# Patient Record
Sex: Male | Born: 1965 | Race: White | Hispanic: No | Marital: Married | Smoking: Former smoker
Health system: Southern US, Community
[De-identification: ages and names within clinical notes are randomized; demographics above are authoritative.]

## PROBLEM LIST (undated history)

## (undated) DIAGNOSIS — M1990 Unspecified osteoarthritis, unspecified site: Secondary | ICD-10-CM

## (undated) DIAGNOSIS — E119 Type 2 diabetes mellitus without complications: Secondary | ICD-10-CM

## (undated) DIAGNOSIS — R519 Headache, unspecified: Secondary | ICD-10-CM

## (undated) DIAGNOSIS — R51 Headache: Secondary | ICD-10-CM

## (undated) DIAGNOSIS — I10 Essential (primary) hypertension: Secondary | ICD-10-CM

## (undated) DIAGNOSIS — K219 Gastro-esophageal reflux disease without esophagitis: Secondary | ICD-10-CM

## (undated) HISTORY — DX: Gastro-esophageal reflux disease without esophagitis: K21.9

## (undated) HISTORY — PX: VASECTOMY: SHX75

## (undated) HISTORY — DX: Headache, unspecified: R51.9

## (undated) HISTORY — DX: Essential (primary) hypertension: I10

## (undated) HISTORY — DX: Headache: R51

## (undated) HISTORY — DX: Type 2 diabetes mellitus without complications: E11.9

## (undated) HISTORY — DX: Unspecified osteoarthritis, unspecified site: M19.90

---
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
## 2009-06-13 ENCOUNTER — Ambulatory Visit: Payer: Self-pay | Admitting: Orthopedic Surgery

## 2014-04-27 ENCOUNTER — Emergency Department: Payer: Self-pay | Admitting: Emergency Medicine

## 2014-09-20 ENCOUNTER — Encounter (INDEPENDENT_AMBULATORY_CARE_PROVIDER_SITE_OTHER): Payer: Self-pay

## 2014-09-20 ENCOUNTER — Ambulatory Visit (INDEPENDENT_AMBULATORY_CARE_PROVIDER_SITE_OTHER): Payer: 59 | Admitting: Internal Medicine

## 2014-09-27 ENCOUNTER — Encounter: Payer: Self-pay | Admitting: Internal Medicine

## 2014-09-27 ENCOUNTER — Ambulatory Visit (INDEPENDENT_AMBULATORY_CARE_PROVIDER_SITE_OTHER): Payer: Commercial Managed Care - PPO | Admitting: Internal Medicine

## 2014-09-27 VITALS — BP 110/80 | HR 73 | Temp 98.4°F | Ht 68.0 in | Wt 186.2 lb

## 2014-09-27 DIAGNOSIS — K219 Gastro-esophageal reflux disease without esophagitis: Secondary | ICD-10-CM

## 2014-09-27 DIAGNOSIS — R42 Dizziness and giddiness: Secondary | ICD-10-CM

## 2014-09-27 DIAGNOSIS — E1165 Type 2 diabetes mellitus with hyperglycemia: Secondary | ICD-10-CM

## 2014-09-27 DIAGNOSIS — Z125 Encounter for screening for malignant neoplasm of prostate: Secondary | ICD-10-CM

## 2014-09-27 DIAGNOSIS — R5383 Other fatigue: Secondary | ICD-10-CM

## 2014-09-27 MED ORDER — PANTOPRAZOLE SODIUM 40 MG PO TBEC: 40.0000 mg | DELAYED_RELEASE_TABLET | Freq: Every day | ORAL | Status: DC

## 2014-09-27 MED ORDER — METFORMIN HCL 1000 MG PO TABS: 1000.0000 mg | ORAL_TABLET | Freq: Two times a day (BID) | ORAL | Status: DC

## 2014-09-27 NOTE — Progress Notes (Signed)
Pre visit review using our clinic review tool, if applicable. No additional management support is needed unless otherwise documented below in the visit note. 

## 2014-09-27 NOTE — Patient Instructions (Signed)
Take metformin 1000mg  twice a day  protonix 40mg  - one per day - take before breakfast  Zantac (ranitidine) 150mg  - take one before evening meal.

## 2014-09-28 ENCOUNTER — Other Ambulatory Visit (INDEPENDENT_AMBULATORY_CARE_PROVIDER_SITE_OTHER): Payer: Commercial Managed Care - PPO

## 2014-09-28 DIAGNOSIS — R5383 Other fatigue: Secondary | ICD-10-CM

## 2014-09-28 DIAGNOSIS — Z125 Encounter for screening for malignant neoplasm of prostate: Secondary | ICD-10-CM

## 2014-09-28 DIAGNOSIS — R42 Dizziness and giddiness: Secondary | ICD-10-CM | POA: Insufficient documentation

## 2014-09-28 DIAGNOSIS — E1165 Type 2 diabetes mellitus with hyperglycemia: Secondary | ICD-10-CM

## 2014-09-28 DIAGNOSIS — E119 Type 2 diabetes mellitus without complications: Secondary | ICD-10-CM | POA: Insufficient documentation

## 2014-09-28 LAB — BASIC METABOLIC PANEL
BUN: 12 mg/dL (ref 6–23)
CO2: 29 mEq/L (ref 19–32)
CREATININE: 0.9 mg/dL (ref 0.4–1.5)
Calcium: 9.4 mg/dL (ref 8.4–10.5)
Chloride: 100 mEq/L (ref 96–112)
GFR: 93.46 mL/min (ref >60.00)
Glucose, Bld: 335 mg/dL — ABNORMAL HIGH (ref 70–99)
Potassium: 4.2 mEq/L (ref 3.5–5.1)
SODIUM: 135 meq/L (ref 135–145)

## 2014-09-28 LAB — CBC WITH DIFFERENTIAL/PLATELET
BASOS PCT: 0.4 % (ref 0.0–3.0)
Basophils Absolute: 0 10*3/uL (ref 0.0–0.1)
EOS ABS: 0.1 10*3/uL (ref 0.0–0.7)
EOS PCT: 1.8 % (ref 0.0–5.0)
HEMATOCRIT: 44.9 % (ref 39.0–52.0)
Hemoglobin: 15.1 g/dL (ref 13.0–17.0)
LYMPHS PCT: 41.1 % (ref 12.0–46.0)
Lymphs Abs: 2.2 10*3/uL (ref 0.7–4.0)
MCHC: 33.7 g/dL (ref 30.0–36.0)
MCV: 92.9 fl (ref 78.0–100.0)
MONO ABS: 0.4 10*3/uL (ref 0.1–1.0)
Monocytes Relative: 7 % (ref 3.0–12.0)
NEUTROS ABS: 2.7 10*3/uL (ref 1.4–7.7)
Neutrophils Relative %: 49.7 % (ref 43.0–77.0)
Platelets: 234 10*3/uL (ref 150.0–400.0)
RBC: 4.83 Mil/uL (ref 4.22–5.81)
RDW: 12.1 % (ref 11.5–15.5)
WBC: 5.4 10*3/uL (ref 4.0–10.5)

## 2014-09-28 LAB — HEMOGLOBIN A1C: Hgb A1c MFr Bld: 14.5 % — ABNORMAL HIGH (ref 4.6–6.5)

## 2014-09-28 LAB — URINALYSIS, ROUTINE W REFLEX MICROSCOPIC
Bilirubin Urine: NEGATIVE
HGB URINE DIPSTICK: NEGATIVE
Leukocytes, UA: NEGATIVE
Nitrite: NEGATIVE
PH: 6 (ref 5.0–8.0)
RBC / HPF: NONE SEEN (ref 0–?)
SPECIFIC GRAVITY, URINE: 1.02 (ref 1.000–1.030)
TOTAL PROTEIN, URINE-UPE24: NEGATIVE
Urine Glucose: >=1000 — AB
Urobilinogen, UA: 0.2 (ref 0.0–1.0)
WBC UA: NONE SEEN (ref 0–?)

## 2014-09-28 LAB — HEPATIC FUNCTION PANEL
ALT: 23 U/L (ref 0–53)
AST: 17 U/L (ref 0–37)
Albumin: 4 g/dL (ref 3.5–5.2)
Alkaline Phosphatase: 136 U/L — ABNORMAL HIGH (ref 39–117)
Bilirubin, Direct: 0.1 mg/dL (ref 0.0–0.3)
Total Bilirubin: 0.8 mg/dL (ref 0.2–1.2)
Total Protein: 7.1 g/dL (ref 6.0–8.3)

## 2014-09-28 LAB — MICROALBUMIN / CREATININE URINE RATIO
Creatinine,U: 134.2 mg/dL
MICROALB UR: 2.2 mg/dL — AB (ref 0.0–1.9)
MICROALB/CREAT RATIO: 1.6 mg/g (ref 0.0–30.0)

## 2014-09-28 LAB — LIPID PANEL
CHOL/HDL RATIO: 7
CHOLESTEROL: 251 mg/dL — AB (ref 0–200)
HDL: 35.2 mg/dL — AB (ref >39.00)
NonHDL: 215.8
Triglycerides: 254 mg/dL — ABNORMAL HIGH (ref 0.0–149.0)
VLDL: 50.8 mg/dL — AB (ref 0.0–40.0)

## 2014-09-28 LAB — LDL CHOLESTEROL, DIRECT: Direct LDL: 159.3 mg/dL

## 2014-09-28 LAB — TSH: TSH: 2.47 u[IU]/mL (ref 0.35–4.50)

## 2014-09-28 LAB — PSA: PSA: 0.5 ng/mL (ref 0.10–4.00)

## 2014-09-29 ENCOUNTER — Encounter: Payer: Self-pay | Admitting: *Deleted

## 2014-10-02 ENCOUNTER — Encounter: Payer: Self-pay | Admitting: Internal Medicine

## 2014-10-02 DIAGNOSIS — K219 Gastro-esophageal reflux disease without esophagitis: Secondary | ICD-10-CM | POA: Insufficient documentation

## 2014-10-02 NOTE — Progress Notes (Signed)
Subjective:    Patient ID: Victor Wagner, male    DOB: Feb 17, 1966, 49 y.o.   MRN: [id number]  HPI 49 year old male who presents to establish care.  Accompanied by his wife.  History obtained from both of them.  Does not see a regular doctor.  Has family members who are diabetic.  He has been feeling more fatigued.  Some occasional light headedness.  Notices when he changes positions or moves a certain way (getting up fast).  Works long hours.  Works at [employer] - 10 hour days.  States his sister took his blood sugar.  Has ranged 480-550.  Wife reports he has been more thirsty.  He was drinking a lot of soft drinks and gatorade.  Also eating a lot of butter.  He has significant decreased the amount of soft drinks, gatorade and butter.  He eats a lot of bread.  We discussed decreasing carbohydrate intake.  He has lost weight recently.  Wife states previously 210 pounds down to 183 pounds.  No chest pain or tightness.  Does not do any formal exercise, but does stay active.  No bowel change reported.  Has never had a colonoscopy.  Does report increased acid reflux.     Past Medical History  Diagnosis Date  . Frequent headaches     H/O  . Arthritis   . GERD (gastroesophageal reflux disease)   . Hypertension   . Diabetes mellitus     Outpatient Encounter Prescriptions as of 09/27/2014  Medication Sig  . naproxen sodium (ANAPROX) 220 MG tablet Take 220 mg by mouth as needed.  . metFORMIN (GLUCOPHAGE) 1000 MG tablet Take 1 tablet (1,000 mg total) by mouth 2 (two) times daily with a meal.  . pantoprazole (PROTONIX) 40 MG tablet Take 1 tablet (40 mg total) by mouth daily.    Review of Systems Patient denies any headache.  Does report occasional light headedness as outlined.  No chest pain, tightness or palpitations.  No increased shortness of breath, cough or congestion.  No nausea or vomiting.  Increased acid reflux.  No abdominal pain or cramping.  No bowel change, such as diarrhea,  constipation, BRBPR or melana.  Increased thirst.  Increased fatigue.  Sugars as outlined.  Has made some diet changes.  Plans to get even more serious about diet and exercise.       Objective:   Physical Exam Filed Vitals:   09/27/14 1554  BP: 110/80  Pulse: 73  Temp: 98.4 F (36.9 C)   Blood pressure recheck:  63/72  49 year old male in no acute distress.  HEENT:  Nares - clear.  Oropharynx - without lesions. NECK:  Supple.  Nontender.  No audible carotid bruit.  HEART:  Appears to be regular.   LUNGS:  No crackles or wheezing audible.  Respirations even and unlabored.   RADIAL PULSE:  Equal bilaterally.  ABDOMEN:  Soft.  Nontender.  Bowel sounds present and normal.  No audible abdominal bruit.  EXTREMITIES:  No increased edema present.  DP pulses palpable and equal bilaterally.     FEET:  No lesions.        Assessment & Plan:  1. Type 2 diabetes mellitus with hyperglycemia Recently found by family members to have significantly elevated blood sugars.  Discussed at length with the patient and his wife.  Discussed diet and exercise.  Will refer to [facility].  Pt was given a glucometer today.  Was instructed on how to check  his blood sugars.  Will check blood sugars bid and record.  Return in one week.  Start metformin 1000mg  bid.  Blood sugar here today - 360. - Lipid panel; Future - Hepatic function panel; Future - Basic metabolic panel; Future - Hemoglobin A1c; Future - Microalbumin / creatinine urine ratio; Future - Urinalysis, Routine w reflex microscopic; Future  2. Other fatigue May be multifactorial.  Treat his diabetes as outlined.  Works long days.  Check cbc and tsh as well.   - CBC with Differential; Future - TSH; Future  3. Light headedness Appears to just occur occasionally and is positional.  Treat sugars as outlined.  Check routine labs.  Stay hydrated.    4. Prostate cancer screening - PSA; Future  5.  GERD.  Reflux as outlined.  Start protonix as  directed.  Follow for resolution.  May need GI referral.    HEALTH MAINTENANCE.  Will schedule him in the future for a physical.  Check psa with these labs.  Discussed need for colonoscopy.    I spent 45 minutes with the patient and more than 50% of the time was spent in consultation regarding the above, specifically obtaining history, discussing the new diagnosis of diabetes and treatment and planned follow up.

## 2014-10-05 ENCOUNTER — Ambulatory Visit (INDEPENDENT_AMBULATORY_CARE_PROVIDER_SITE_OTHER): Payer: Commercial Managed Care - PPO | Admitting: Internal Medicine

## 2014-10-05 ENCOUNTER — Other Ambulatory Visit: Payer: Self-pay | Admitting: *Deleted

## 2014-10-05 ENCOUNTER — Encounter: Payer: Self-pay | Admitting: Internal Medicine

## 2014-10-05 VITALS — BP 120/80 | HR 82 | Temp 98.5°F | Ht 68.0 in | Wt 190.0 lb

## 2014-10-05 DIAGNOSIS — R5383 Other fatigue: Secondary | ICD-10-CM

## 2014-10-05 DIAGNOSIS — E7800 Pure hypercholesterolemia, unspecified: Secondary | ICD-10-CM

## 2014-10-05 DIAGNOSIS — E1165 Type 2 diabetes mellitus with hyperglycemia: Secondary | ICD-10-CM

## 2014-10-05 DIAGNOSIS — K219 Gastro-esophageal reflux disease without esophagitis: Secondary | ICD-10-CM

## 2014-10-05 MED ORDER — INSULIN DETEMIR 100 UNIT/ML FLEXPEN: 20.0000 [IU] | Freq: Every day | SUBCUTANEOUS | Status: DC

## 2014-10-05 MED ORDER — INSULIN PEN NEEDLE 32G X 4 MM MISC: Status: DC

## 2014-10-05 NOTE — Patient Instructions (Signed)
Start 20 units of Levemir in the am.    Increase protonix to 40mg  twice a day (before breakfast and before supper)

## 2014-10-05 NOTE — Progress Notes (Signed)
Pre visit review using our clinic review tool, if applicable. No additional management support is needed unless otherwise documented below in the visit note. 

## 2014-10-09 ENCOUNTER — Encounter: Payer: Self-pay | Admitting: Internal Medicine

## 2014-10-09 DIAGNOSIS — E7800 Pure hypercholesterolemia, unspecified: Secondary | ICD-10-CM | POA: Insufficient documentation

## 2014-10-09 NOTE — Progress Notes (Signed)
Subjective:    Patient ID: Victor Wagner, male    DOB: 07-13-1966, 49 y.o.   MRN: [id number]  HPI 49 year old male recently diagnosed with diabetes.  Also found to have elevated cholesterol.  He is accompanied by his wife.  History obtained from both of them.  He has been trying to adjust his diet.  Discussed exercise.  He has cut out soft drinks.  Trying to decrease his bread intake.  Still drinks an increased amount of sweet tea.  Discussed increased carbohydrate intake.  Eats out a lot.  No chest pain or tightness.  Does not do any formal exercise, but does stay active.  No bowel change reported.  Has never had a colonoscopy.  Does report increased acid reflux.  The protonix has not made a difference yet.  Only started last week.  No increased thirst.  Feels better.  Blood sugars averaging 200-300 now.     Past Medical History  Diagnosis Date  . Frequent headaches     H/O  . Arthritis   . GERD (gastroesophageal reflux disease)   . Hypertension   . Diabetes mellitus     Outpatient Encounter Prescriptions as of 10/05/2014  Medication Sig  . metFORMIN (GLUCOPHAGE) 1000 MG tablet Take 1 tablet (1,000 mg total) by mouth 2 (two) times daily with a meal.  . naproxen sodium (ANAPROX) 220 MG tablet Take 220 mg by mouth as needed.  Letta Pate VERIO test strip 2 (two) times daily. for testing  . pantoprazole (PROTONIX) 40 MG tablet Take 1 tablet (40 mg total) by mouth daily.  . [DISCONTINUED] insulin detemir (LEVEMIR) 100 unit/ml SOLN Inject 0.2 mLs (20 Units total) into the skin at bedtime.    Review of Systems Patient denies any headache.  No significant light headedness reported.  Feels better.   No chest pain, tightness or palpitations.  No increased shortness of breath, cough or congestion.  No nausea or vomiting.  Increased acid reflux.  Started on protonix last week.  No abdominal pain or cramping.  No bowel change, such as diarrhea, constipation, BRBPR or melana.  Increased thirst has  improved.    Fatigue is better.   Sugars as outlined.  Has made some diet changes.  Plans to get even more serious about diet and exercise.  Discussed insulin.  He is agreeable today to try one shot per day.         Objective:   Physical Exam  Filed Vitals:   10/05/14 1254  BP: 120/80  Pulse: 82  Temp: 98.5 F (68.21 C)   49 year old male in no acute distress.  HEENT:  Nares - clear.  Oropharynx - without lesions. NECK:  Supple.  Nontender.  No audible carotid bruit.  HEART:  Appears to be regular.   LUNGS:  No crackles or wheezing audible.  Respirations even and unlabored.   RADIAL PULSE:  Equal bilaterally.  ABDOMEN:  Soft.  Nontender.  Bowel sounds present and normal.  No audible abdominal bruit.  EXTREMITIES:  No increased edema present.  DP pulses palpable and equal bilaterally.     FEET:  No lesions.        Assessment & Plan:  1. Gastroesophageal reflux disease, esophagitis presence not specified Persistent.  On protonix.  Increase to bid.  Follow.  May need GI referral.  Diet adjustment as discussed.    2. Type 2 diabetes mellitus with hyperglycemia Sugars elevated as outlined.  On metformin.  Start 20  units levemir q am.  Will probably need short acting with meals.  He agreed to start one shot per day.  Follow sugars.  Check and record at least bid.  Get him to f/u with endocrinology.  Refer to [facility].  Discussed diet adjustment and exercise.  Follow.  Discussed with him regarding titrating the levemir if sugars remain over 150.  Can adjust up 2 units q 4 days as directed.  Folllow.  Pt was instructed on proper way to give himself insulin.    3. Other fatigue Better with treatment of sugars follow.    4. Hypercholesterolemia Elevated cholesterol found on recent labs.  Discussed with him regarding diet adjustment and exercise.  May need medication.  Wants to work on sugars first.  Follow.     HEALTH MAINTENANCE.  Will schedule him in the future for a physical.    Discussed need for colonoscopy.    I spent 25 minutes with the patient and more than 50% of the time was spent in consultation regarding the above.

## 2014-10-10 ENCOUNTER — Ambulatory Visit: Payer: 59 | Admitting: Internal Medicine

## 2014-10-12 ENCOUNTER — Ambulatory Visit: Payer: Commercial Managed Care - PPO | Admitting: Endocrinology

## 2014-10-19 ENCOUNTER — Ambulatory Visit (INDEPENDENT_AMBULATORY_CARE_PROVIDER_SITE_OTHER): Payer: Commercial Managed Care - PPO | Admitting: Endocrinology

## 2014-10-19 ENCOUNTER — Encounter: Payer: Self-pay | Admitting: Endocrinology

## 2014-10-19 VITALS — BP 112/82 | HR 74 | Ht 68.0 in | Wt 191.5 lb

## 2014-10-19 DIAGNOSIS — E7800 Pure hypercholesterolemia, unspecified: Secondary | ICD-10-CM

## 2014-10-19 DIAGNOSIS — E1165 Type 2 diabetes mellitus with hyperglycemia: Secondary | ICD-10-CM

## 2014-10-19 LAB — HM DIABETES FOOT EXAM: HM Diabetic Foot Exam: NORMAL

## 2014-10-19 MED ORDER — INSULIN ASPART 100 UNIT/ML FLEXPEN: PEN_INJECTOR | SUBCUTANEOUS | Status: DC

## 2014-10-19 NOTE — Progress Notes (Signed)
Reason for visit-  Victor Wagner is a 49 y.o.-year-old male, referred by his PCP,  Dale Badger, MD for management of Type 2 diabetes, uncontrolled, without complications. Here with wife. Recently established care with Dr Lorin Picket.   HPI- Patient has been diagnosed with diabetes in Jan 2016 after being symptomatic with polydipsia/polyuria and weight loss. FS checked at home ranged 480-550. Following this, he has started making dietary changes and carb reductions and sugars started to improve. Started on levemir mid jan 2016.     Taking his medications and tolerating them well.   Pt is currently on a regimen of: - Metformin 1000 mg po bid - Levemir 20 units qam ~9am    Last hemoglobin A1c was: Lab Results  Component Value Date   HGBA1C 14.5* 09/28/2014     Pt checks his sugars 2-3 a day . Uses One Touch verio meter glucometer. By meter review they are:  PREMEAL Breakfast Lunch Dinner Bedtime Overall  Glucose range: 176-213  200-308 180-220   Mean/median:        POST-MEAL PC Breakfast PC Lunch PC Dinner  Glucose range: 100-134 137-301   Mean/median:       Hypoglycemia-  No lows. Lowest sugar was n/a; he has hypoglycemia awareness at 70.   Dietary habits- eats three times daily. Tries to limit carbs, sweetened beverages, sodas, desserts. Salads for lunch, biscuit for BF, supper late around 9 pm and having hard time controlling carb intake at that time. He does the cooking for the home. Cut out all sweetened beverages. Cut out bread intake significantly.  Exercise- works at [employer] - stable recently [employer] from Last 3 Encounters:  10/19/14 191 lb 8 oz (86.864 kg)  10/05/14 190 lb (86.183 kg)  09/27/14 186 lb 4 oz (84.482 kg)    Diabetes Complications-  Nephropathy- No  CKD, last BUN/creatinine-  Lab Results  Component Value Date   BUN 12 09/28/2014   CREATININE 0.9 09/28/2014   Lab Results  Component Value Date   GFR 93.46 09/28/2014   No  components found for: MICROALBCREAT   Retinopathy- Yes/No, ?, Last DEE was in n/a- feels that vision is blurry B/L, somewhat worse after starting the medications recently, using reading glasses Neuropathy- no numbness and tingling in his feet. No known neuropathy.  Associated history - No CAD . No prior stroke. No hypothyroidism. his last TSH was  Lab Results  Component Value Date   TSH 2.47 09/28/2014    Hyperlipidemia-  his last set of lipids were uncontrolled- Currently on dietary therapy. Tolerating well.   Lab Results  Component Value Date   CHOL 251* 09/28/2014   HDL 35.20* 09/28/2014   LDLDIRECT 159.3 09/28/2014   TRIG 254.0* 09/28/2014   CHOLHDL 7 09/28/2014    Blood Pressure/HTN- Patient's blood pressure is well controlled today.  Pt has FH of DM in father and sister.  I have reviewed the patient's past medical history, family and social history, surgical history, medications and allergies.  Past Medical History  Diagnosis Date  . Frequent headaches     H/O  . Arthritis   . GERD (gastroesophageal reflux disease)   . Hypertension   . Diabetes mellitus    Past Surgical History  Procedure Laterality Date  . Hernia repair  1984  . Vasectomy     Family History  Problem Relation Age of Onset  . Arthritis Father   . Heart disease Father   . Hypertension Father   .  Diabetes Father   . Heart disease Maternal Grandfather   . Diabetes Sister    History   Social History  . Marital Status: Married    Spouse Name: N/A    Number of Children: N/A  . Years of Education: N/A   Occupational History  . Not on file.   Social History Main Topics  . Smoking status: Never Smoker   . Smokeless tobacco: Current User    Types: Chew, Snuff  . Alcohol Use: 0.0 oz/week    0 Not specified per week  . Drug Use: Not on file  . Sexual Activity: Not on file   Other Topics Concern  . Not on file   Social History Narrative   Current Outpatient Prescriptions on File Prior  to Visit  Medication Sig Dispense Refill  . insulin detemir (LEVEMIR) 100 unit/ml SOLN Inject 0.2 mLs (20 Units total) into the skin at bedtime. 1 vial 2  . Insulin Pen Needle 32G X 4 MM MISC To be used once daily to inject insulin into skin 100 each 2  . metFORMIN (GLUCOPHAGE) 1000 MG tablet Take 1 tablet (1,000 mg total) by mouth 2 (two) times daily with a meal. 60 tablet 1  . naproxen sodium (ANAPROX) 220 MG tablet Take 220 mg by mouth as needed.    Letta Pate. ONETOUCH VERIO test strip 2 (two) times daily. for testing  0  . pantoprazole (PROTONIX) 40 MG tablet Take 1 tablet (40 mg total) by mouth daily. (Patient not taking: Reported on 10/19/2014) 30 tablet 1   No current facility-administered medications on file prior to visit.   No Known Allergies   Review of Systems: [x]  complains of  [  ] denies General:   [ x ] Recent weight change [  ] Fatigue  [  ] Loss of appetite Eyes: [ x ]  Vision Difficulty [  ]  Eye pain ENT: [  ]  Hearing difficulty [  ]  Difficulty Swallowing CVS: [  ] Chest pain [  ]  Palpitations/Irregular Heart beat [  ]  Shortness of breath lying flat [  ] Swelling of legs Resp: [  ] Frequent Cough [  ] Shortness of Breath  [  ]  Wheezing GI: [ x ] Heartburn  [  ] Nausea or Vomiting  [  ] Diarrhea [  ] Constipation  [  ] Abdominal Pain GU: [  ]  Polyuria  [  ]  nocturia Bones/joints:  [  ]  Muscle aches  [  ] Joint Pain  [  ] Bone pain Skin/Hair/Nails: [  ]  Rash  [  ] New stretch marks [  ]  Itching [  ] Hair loss [  ]  Excessive hair growth Reproduction: [  ] Low sexual desire , [  ]  Women: Menstrual cycle problems [  ]  Women: Breast Discharge [  ] Men: Difficulty with erections [  ]  Men: Enlarged Breasts CNS: [  ] Frequent Headaches [ x ] Blurry vision [  ] Tremors [  ] Seizures [  ] Loss of consciousness [  ] Localized weakness Endocrine: [  ]  Excess thirst [  ]  Feeling excessively hot [  ]  Feeling excessively cold Heme: [  ]  Easy bruising [  ]  Enlarged glands or  lumps in neck Allergy: [  ]  Food allergies [  ] Environmental allergies  PE: BP 112/82 mmHg  Pulse  74  Ht  (1.727 m)  Wt 191 lb 8 oz (86.864 kg)  BMI 29.12 kg/m2  SpO2 98% Wt Readings from Last 3 Encounters:  10/19/14 191 lb 8 oz (86.864 kg)  10/05/14 190 lb (86.183 kg)  09/27/14 186 lb 4 oz (84.482 kg)   GENERAL: No acute distress, well developed HEENT:  Eye exam shows normal external appearance. Oral exam shows normal mucosa .  NECK:   Neck exam shows no lymphadenopathy. No Carotids bruits. Thyroid is not enlarged and no nodules felt.  no acanthosis nigricans LUNGS:         Chest is symmetrical. Lungs are clear to auscultation.Marland Kitchen   HEART:         Heart sounds:  S1 and S2 are normal. No murmurs or clicks heard. ABDOMEN:  No Distention present. Liver and spleen are not palpable. No other mass or tenderness present.  EXTREMITIES:     There is no edema. 2+ DP pulses  NEUROLOGICAL:     Grossly intact.            Diabetic foot exam done with shoes and socks removed: Normal Monofilament testing bilaterally. No deformities of toes.  Nails discolored and dystrophic. Skin normal color. No open wounds. Dry skin. Some callosities. MUSCULOSKELETAL:       There is no enlargement or gross deformity of the joints.  SKIN:       No rash  ASSESSMENT AND PLAN: Problem List Items Addressed This Visit      Endocrine   Diabetes mellitus - Primary     We had a lengthy discussion about DM physiology, normal and abnormal sugars , A1c and goal, Diet and exercise, checking sugars, risks of long term complications with uncontrolled sugars, medication regimens and in detail about kinds of insulin therapy and its use.   Discussed that he should check his sugars 4 x daily.  He has made several dietary modifications and I have congratulated him on this, while providing him with specific substitutions for carbs.  He will try to stay active and try to stay hydrated. Foot care, eye exams, and hypoglycemia  recognition and treatment protocol discussed.  Discussed need for addition for meal time insulin and he is agreeable. Generally, post BF readings seem fine. Will start Novolog at 5 units with lunch and supper.  Continue current levemir- rotate insulin sites.  Continue current metformin. Update urine MA when sugars are better.  RTC 2weeks.      Relevant Medications   insulin aspart (NOVOLOG FLEXPEN) 100 UNIT/ML FlexPen     Other   Hypercholesterolemia    Discussed low fat diet and exercise.  Will reassess at next few visits and consider statin therapy if the LDL stays high despite lifestyle efforts.  Expect improvement in TG levels with better sugar control.             - Return to clinic in 2 weeks with sugar log/meter.  Indyah Saulnier [facility] 10/19/2014 10:54 AM

## 2014-10-19 NOTE — Assessment & Plan Note (Addendum)
  We had a lengthy discussion about DM physiology, normal and abnormal sugars , A1c and goal, Diet and exercise, checking sugars, risks of long term complications with uncontrolled sugars, medication regimens and in detail about kinds of insulin therapy and its use.   Discussed that he should check his sugars 4 x daily.  He has made several dietary modifications and I have congratulated him on this, while providing him with specific substitutions for carbs.  He will try to stay active and try to stay hydrated. Foot care, eye exams, and hypoglycemia recognition and treatment protocol discussed.  Discussed need for addition for meal time insulin and he is agreeable. Generally, post BF readings seem fine. Will start Novolog at 5 units with lunch and supper.  Continue current levemir- rotate insulin sites.  Continue current metformin. Update urine MA when sugars are better.  RTC 2weeks.

## 2014-10-19 NOTE — Assessment & Plan Note (Signed)
Discussed low fat diet and exercise.  Will reassess at next few visits and consider statin therapy if the LDL stays high despite lifestyle efforts.  Expect improvement in TG levels with better sugar control.

## 2014-10-19 NOTE — Patient Instructions (Addendum)
Check sugars 4 x daily ( before each meal and at bedtime).  Record them in a log book and bring that/meter to next appointment.   Continue levemir at current doses. Continue metformin.  Start Novolog insulin at 5 units prior to lunch and supper.  If you skip that meal, then don't take that shot of Novolog.   Notify me if start having low sugars.   Please come back for a follow-up appointment in 2 weeks

## 2014-10-19 NOTE — Progress Notes (Signed)
Pre visit review using our clinic review tool, if applicable. No additional management support is needed unless otherwise documented below in the visit note. 

## 2014-10-21 ENCOUNTER — Telehealth: Payer: Self-pay

## 2014-10-21 MED ORDER — INSULIN LISPRO 100 UNIT/ML (KWIKPEN): PEN_INJECTOR | SUBCUTANEOUS | Status: DC

## 2014-10-21 NOTE — Telephone Encounter (Signed)
Patient notified

## 2014-10-21 NOTE — Telephone Encounter (Signed)
Okay to Switch Novolog to Humalog kwikpen at same doses. thanks

## 2014-10-21 NOTE — Telephone Encounter (Signed)
D/c'ed novolog and and sent new Humalog kwikpen Rx at same dose to [facility].

## 2014-10-21 NOTE — Telephone Encounter (Signed)
Patient's wife called stating that [facility] told her that the Novolog Rx needed a Prior authorization form filled out and once it is approved that would have to use mail order to received that Rx. Patient called insurance to get clarification and the rep told her that they will cover Humalog without a prior authorization and they will not have to use mail order. Caller would like to know if  patient's Rx can be switched to Humalog and sent to [facility] pharmacy? Please advise.

## 2014-11-02 ENCOUNTER — Ambulatory Visit (INDEPENDENT_AMBULATORY_CARE_PROVIDER_SITE_OTHER): Payer: Commercial Managed Care - PPO | Admitting: Endocrinology

## 2014-11-02 ENCOUNTER — Encounter: Payer: Self-pay | Admitting: Endocrinology

## 2014-11-02 VITALS — BP 110/80 | HR 75 | Ht 68.0 in | Wt 194.0 lb

## 2014-11-02 DIAGNOSIS — E119 Type 2 diabetes mellitus without complications: Secondary | ICD-10-CM

## 2014-11-02 DIAGNOSIS — E7800 Pure hypercholesterolemia, unspecified: Secondary | ICD-10-CM

## 2014-11-02 MED ORDER — ONETOUCH VERIO VI STRP: 1.0000 | ORAL_STRIP | Freq: Two times a day (BID) | Status: AC

## 2014-11-02 NOTE — Assessment & Plan Note (Signed)
  Overall improvement in sugars. Too early to update A1c.  Asked him to check sugars 3 x daily atleast - he could still have unrecognized hyperglycemia at the times that he is not checking and this can affect his A1c.  Continue to work on diet. Continue to stay hydrated and work on staying active.  Continue current metformin and levemir.  Increase Humalog to 8 units with supper and start taking Humalog at lunch time  On the days that he eats a meal.   Update urine MA when sugars are better. Update eye exams over next few months.   RTC 2 months.

## 2014-11-02 NOTE — Assessment & Plan Note (Signed)
encouraged low fat diet and exercise.  Will reassess at next few visits and consider statin therapy if the LDL stays high despite lifestyle efforts.  Expect improvement in TG levels with better sugar control.

## 2014-11-02 NOTE — Patient Instructions (Addendum)
  Continue current lantus and metformin.  Change Humalog to 8 units with supper.  Start taking Humalog 4 units if eating a proper lunch .  Work on cutting back carbs at supper.   Check sugars 3 x daily.  Return with sugar readings for Dr Lorin PicketScott appointment.  Please come back for a follow-up appointment in 2 months.

## 2014-11-02 NOTE — Progress Notes (Signed)
Reason for visit-  Victor Wagner is a 49 y.o.-year-old male, referred by his PCP,  Dale DurhamScott, Charlene, MD for management of Type 2 diabetes, uncontrolled, without complications. Here with wife. Recently established care with Dr Lorin PicketScott. Last visit 2 weeks ago.    HPI- Patient has been diagnosed with diabetes in Jan 2016 after being symptomatic with polydipsia/polyuria and weight loss. FS checked at home ranged 480-550. Following this, he has started making dietary changes and carb reductions and sugars started to improve. Started on levemir mid jan 2016.  Started on meal time insulin Jan 2016.    Taking his medications and tolerating them well. Overall feeling much better since last visit, and has more energy.   Pt is currently on a regimen of: - Metformin 1000 mg po bid - Levemir 20 units qam ~9am -Humalog 5 units with supper,  *not doing the 5 units with lunch as directed as active at work and not always having lunch- sometimes just eating pack of crackers . Eating BF consistently.    Last hemoglobin A1c was: Lab Results  Component Value Date   HGBA1C 14.5* 09/28/2014     Pt checks his sugars 2-3 a day . Uses One Touch verio meter glucometer. By meter review they are:  PREMEAL Breakfast Lunch Dinner Bedtime Overall  Glucose range: 154-233 111-195 123-224 167-213   Mean/median:        POST-MEAL PC Breakfast PC Lunch PC Dinner  Glucose range:     Mean/median:       Hypoglycemia-  No lows. Lowest sugar was n/a; he has hypoglycemia awareness at 70.   Dietary habits- eats 2 times daily. Tries to limit carbs, sweetened beverages, sodas, desserts. Salads for lunch, biscuit for BF, supper late around 9 pm and having hard time controlling carb intake at that time- eating desserts then. He does the cooking for the home. Cut out all sweetened beverages. Cut out bread intake significantly.  Exercise- works at [employer] - up [employer] Readings from Last 3 Encounters:  11/02/14 194 lb  (87.998 kg)  10/19/14 191 lb 8 oz (86.864 kg)  10/05/14 190 lb (86.183 kg)    Diabetes Complications-  Nephropathy- No  CKD, last BUN/creatinine-  Lab Results  Component Value Date   BUN 12 09/28/2014   CREATININE 0.9 09/28/2014   Lab Results  Component Value Date   GFR 93.46 09/28/2014   No components found for: MICROALBCREAT   Retinopathy- Yes/No, ?, Last DEE was in n/a- feels that vision is blurry B/L, better recently. Neuropathy- no numbness and tingling in his feet. No known neuropathy.  Associated history - No CAD . No prior stroke. No hypothyroidism. his last TSH was  Lab Results  Component Value Date   TSH 2.47 09/28/2014    Hyperlipidemia-  his last set of lipids were uncontrolled- Currently on dietary therapy. Tolerating well.   Lab Results  Component Value Date   CHOL 251* 09/28/2014   HDL 35.20* 09/28/2014   LDLDIRECT 159.3 09/28/2014   TRIG 254.0* 09/28/2014   CHOLHDL 7 09/28/2014    Blood Pressure/HTN- Patient's blood pressure is well controlled today.   I have reviewed the patient's past medical history, medications and allergies.   Current Outpatient Prescriptions on File Prior to Visit  Medication Sig Dispense Refill  . insulin detemir (LEVEMIR) 100 unit/ml SOLN Inject 0.2 mLs (20 Units total) into the skin at bedtime. 1 vial 2  . insulin lispro (HUMALOG KWIKPEN) 100 UNIT/ML KiwkPen Use 5 units  [place] at lunch time and at supper. 15 mL 3  . Insulin Pen Needle 32G X 4 MM MISC To be used once daily to inject insulin into skin 100 each 2  . metFORMIN (GLUCOPHAGE) 1000 MG tablet Take 1 tablet (1,000 mg total) by mouth 2 (two) times daily with a meal. 60 tablet 1  . naproxen sodium (ANAPROX) 220 MG tablet Take 220 mg by mouth as needed.    . pantoprazole (PROTONIX) 40 MG tablet Take 1 tablet (40 mg total) by mouth daily. (Patient not taking: Reported on 10/19/2014) 30 tablet 1   No current facility-administered medications on file prior to visit.   No Known  Allergies   Review of Systems- [ x ]  Complains of    [  ]  denies [  ] Recent weight change [  ]  Fatigue [  ] polydipsia [  ] polyuria [  ]  nocturia [  ]  vision difficulty [  ] chest pain [  ] shortness of breath [  ] leg swelling [  ] cough [  ] nausea/vomiting [  ] diarrhea [  ] constipation [  ] abdominal pain [  ]  tingling/numbness in extremities [  ]  concern with feet ( wounds/sores)   PE: BP 110/80 mmHg  Pulse 75  Ht  (1.727 m)  Wt 194 lb (87.998 kg)  BMI 29.50 kg/m2  SpO2 97% Wt Readings from Last 3 Encounters:  11/02/14 194 lb (87.998 kg)  10/19/14 191 lb 8 oz (86.864 kg)  10/05/14 190 lb (86.183 kg)   Exam: deferred  ASSESSMENT AND PLAN: Problem List Items Addressed This Visit      Endocrine   Diabetes mellitus - Primary     Overall improvement in sugars. Too early to update A1c.  Asked him to check sugars 3 x daily atleast - he could still have unrecognized hyperglycemia at the times that he is not checking and this can affect his A1c.  Continue to work on diet. Continue to stay hydrated and work on staying active.  Continue current metformin and levemir.  Increase Humalog to 8 units with supper and start taking Humalog at lunch time  On the days that he eats a meal.   Update urine MA when sugars are better. Update eye exams over next few months.   RTC 2 months.          Other   Hypercholesterolemia    encouraged low fat diet and exercise.  Will reassess at next few visits and consider statin therapy if the LDL stays high despite lifestyle efforts.  Expect improvement in TG levels with better sugar control.               - Return to clinic in 2 months with sugar log/meter.  Holliday Sheaffer PUSHKAR 11/02/2014 12:00 PM

## 2014-11-07 ENCOUNTER — Ambulatory Visit: Payer: Commercial Managed Care - PPO | Admitting: Internal Medicine

## 2014-11-21 ENCOUNTER — Other Ambulatory Visit: Payer: Self-pay | Admitting: *Deleted

## 2014-11-21 MED ORDER — PANTOPRAZOLE SODIUM 40 MG PO TBEC: 40.0000 mg | DELAYED_RELEASE_TABLET | Freq: Every day | ORAL | Status: DC

## 2014-11-21 MED ORDER — METFORMIN HCL 1000 MG PO TABS: 1000.0000 mg | ORAL_TABLET | Freq: Two times a day (BID) | ORAL | Status: DC

## 2014-11-28 ENCOUNTER — Ambulatory Visit
Admit: 2014-11-28 | Disposition: A | Discharge status: Home / Self Care | Payer: Self-pay | Attending: Internal Medicine | Admitting: Internal Medicine

## 2014-12-20 ENCOUNTER — Ambulatory Visit: Payer: Commercial Managed Care - PPO | Admitting: Internal Medicine

## 2014-12-23 ENCOUNTER — Ambulatory Visit
Admit: 2014-12-23 | Disposition: A | Discharge status: Home / Self Care | Payer: Self-pay | Attending: Internal Medicine | Admitting: Internal Medicine

## 2015-01-04 ENCOUNTER — Ambulatory Visit: Payer: Commercial Managed Care - PPO | Admitting: Endocrinology

## 2015-02-22 ENCOUNTER — Encounter: Payer: Self-pay | Admitting: Internal Medicine

## 2015-02-22 ENCOUNTER — Ambulatory Visit (INDEPENDENT_AMBULATORY_CARE_PROVIDER_SITE_OTHER): Payer: 59 | Admitting: Internal Medicine

## 2015-02-22 ENCOUNTER — Ambulatory Visit (INDEPENDENT_AMBULATORY_CARE_PROVIDER_SITE_OTHER)
Admission: RE | Admit: 2015-02-22 | Discharge: 2015-02-22 | Disposition: A | Discharge status: Home / Self Care | Payer: 59 | Source: Ambulatory Visit | Attending: Internal Medicine | Admitting: Internal Medicine

## 2015-02-22 VITALS — BP 135/82 | HR 69 | Temp 97.7°F | Ht 68.0 in | Wt 200.4 lb

## 2015-02-22 DIAGNOSIS — M25512 Pain in left shoulder: Secondary | ICD-10-CM

## 2015-02-22 DIAGNOSIS — E119 Type 2 diabetes mellitus without complications: Secondary | ICD-10-CM

## 2015-02-22 DIAGNOSIS — K219 Gastro-esophageal reflux disease without esophagitis: Secondary | ICD-10-CM

## 2015-02-22 DIAGNOSIS — Z0000 Encounter for general adult medical examination without abnormal findings: Secondary | ICD-10-CM

## 2015-02-22 DIAGNOSIS — E7800 Pure hypercholesterolemia, unspecified: Secondary | ICD-10-CM

## 2015-02-22 LAB — BASIC METABOLIC PANEL
BUN: 17 mg/dL (ref 6–23)
CHLORIDE: 102 meq/L (ref 96–112)
CO2: 30 mEq/L (ref 19–32)
Calcium: 9.1 mg/dL (ref 8.4–10.5)
Creatinine, Ser: 0.97 mg/dL (ref 0.40–1.50)
GFR: 86.68 mL/min (ref >60.00)
GLUCOSE: 117 mg/dL — AB (ref 70–99)
Potassium: 4.1 mEq/L (ref 3.5–5.1)
Sodium: 136 mEq/L (ref 135–145)

## 2015-02-22 LAB — HEPATIC FUNCTION PANEL
ALBUMIN: 4.3 g/dL (ref 3.5–5.2)
ALT: 14 U/L (ref 0–53)
AST: 16 U/L (ref 0–37)
Alkaline Phosphatase: 69 U/L (ref 39–117)
Bilirubin, Direct: 0.1 mg/dL (ref 0.0–0.3)
Total Bilirubin: 0.5 mg/dL (ref 0.2–1.2)
Total Protein: 7.4 g/dL (ref 6.0–8.3)

## 2015-02-22 LAB — LIPID PANEL
CHOLESTEROL: 194 mg/dL (ref 0–200)
HDL: 46 mg/dL (ref >39.00)
LDL Cholesterol: 128 mg/dL — ABNORMAL HIGH (ref 0–99)
NONHDL: 148
TRIGLYCERIDES: 99 mg/dL (ref 0.0–149.0)
Total CHOL/HDL Ratio: 4
VLDL: 19.8 mg/dL (ref 0.0–40.0)

## 2015-02-22 LAB — HEMOGLOBIN A1C: HEMOGLOBIN A1C: 5.9 % (ref 4.6–6.5)

## 2015-02-22 MED ORDER — INSULIN DETEMIR 100 UNIT/ML FLEXPEN: 20.0000 [IU] | Freq: Every day | SUBCUTANEOUS | Status: DC

## 2015-02-22 MED ORDER — METFORMIN HCL 1000 MG PO TABS: 1000.0000 mg | ORAL_TABLET | Freq: Two times a day (BID) | ORAL | Status: DC

## 2015-02-22 MED ORDER — INSULIN LISPRO 100 UNIT/ML (KWIKPEN): PEN_INJECTOR | SUBCUTANEOUS | Status: DC

## 2015-02-22 MED ORDER — INSULIN PEN NEEDLE 32G X 4 MM MISC: Status: DC

## 2015-02-22 NOTE — Progress Notes (Signed)
Pre visit review using our clinic review tool, if applicable. No additional management support is needed unless otherwise documented below in the visit note. 

## 2015-02-22 NOTE — Progress Notes (Signed)
Patient ID: JAQUAVION MCCANNON, male   DOB: 11-Feb-1966, 49 y.o.   MRN: [id number]   Subjective:    Patient ID: FRAZIER BALFOUR, male    DOB: 12-06-1965, 49 y.o.   MRN: [id number]  HPI  Patient here for a scheduled follow up.  Has adjusted his diet.  Has cut back on sodas.  Cut back on carbs.  Taking levemir - 20 units q hs.  Using short acting with meals.  Brought in no sugar readings.  Highest am reading - 134.  Feels better.  Does have increased pain in both shoulders.  Left greater than right.  Some increased pain with rom and extension.  Notices some BRB with wiping only after takes BCs.  Taking protonix bid.  This controls symptoms.  If off, symptoms return.  No cardiac symptoms with increased activity or exertion.  Breathing stable.     Past Medical History  Diagnosis Date  . Frequent headaches     H/O  . Arthritis   . GERD (gastroesophageal reflux disease)   . Hypertension   . Diabetes mellitus     Outpatient Encounter Prescriptions as of 02/22/2015  Medication Sig  . insulin detemir (LEVEMIR) 100 unit/ml SOLN Inject 0.2 mLs (20 Units total) into the skin daily.  . insulin lispro (HUMALOG KWIKPEN) 100 UNIT/ML KiwkPen Use 5 units [place] at lunch time and at supper.  . Insulin Pen Needle 32G X 4 MM MISC To be used once daily to inject insulin into skin  . metFORMIN (GLUCOPHAGE) 1000 MG tablet Take 1 tablet (1,000 mg total) by mouth 2 (two) times daily with a meal.  . naproxen sodium (ANAPROX) 220 MG tablet Take 220 mg by mouth as needed.  Glory Rosebush VERIO test strip 1 each by Other route 2 (two) times daily. for testing  . pantoprazole (PROTONIX) 40 MG tablet Take 1 tablet (40 mg total) by mouth daily.  . [DISCONTINUED] insulin detemir (LEVEMIR) 100 unit/ml SOLN Inject 0.2 mLs (20 Units total) into the skin at bedtime.  . [DISCONTINUED] insulin lispro (HUMALOG KWIKPEN) 100 UNIT/ML KiwkPen Use 5 units  at lunch time and at supper.  . [DISCONTINUED] Insulin Pen Needle 32G X 4 MM MISC To be  used once daily to inject insulin into skin  . [DISCONTINUED] metFORMIN (GLUCOPHAGE) 1000 MG tablet Take 1 tablet (1,000 mg total) by mouth 2 (two) times daily with a meal.   No facility-administered encounter medications on file as of 02/22/2015.    Review of Systems  Constitutional: Negative for appetite change and unexpected weight change.  HENT: Negative for congestion and sinus pressure.   Respiratory: Negative for cough, chest tightness and shortness of breath.   Cardiovascular: Negative for chest pain, palpitations and leg swelling.  Gastrointestinal: Negative for nausea, vomiting, abdominal pain and diarrhea.  Musculoskeletal:       Bilateral shoulder pain.  Left > right.    Skin: Negative for color change and rash.  Neurological: Negative for dizziness, light-headedness and headaches.  Psychiatric/Behavioral: Negative for dysphoric mood and agitation.       Objective:    Physical Exam  Constitutional: He appears well-developed and well-nourished. No distress.  HENT:  Nose: Nose normal.  Mouth/Throat: Oropharynx is clear and moist.  Neck: Neck supple. No thyromegaly present.  Cardiovascular: Normal rate and regular rhythm.   Pulmonary/Chest: Effort normal and breath sounds normal. No respiratory distress.  Abdominal: Soft. Bowel sounds are normal. There is no tenderness.  Musculoskeletal: He exhibits no edema or  tenderness.  Increased pain with extension of left arm.    Lymphadenopathy:    He has no cervical adenopathy.  Skin: No rash noted. No erythema.  Psychiatric: He has a normal mood and affect. His behavior is normal.    BP 135/82 mmHg  Pulse 69  Temp(Src) 97.7 F (36.5 C) (Oral)  Ht _0  (1.727 m)  Wt 200 lb 6 oz (90.89 kg)  BMI 30.47 kg/m2  SpO2 97% Wt Readings from Last 3 Encounters:  02/22/15 200 lb 6 oz (90.89 kg)  11/02/14 194 lb (87.998 kg)  10/19/14 191 lb 8 oz (86.864 kg)     Lab Results  Component Value Date   WBC 5.4 09/28/2014   HGB  15.1 09/28/2014   HCT 44.9 09/28/2014   PLT 234.0 09/28/2014   GLUCOSE 117* 02/22/2015   CHOL 194 02/22/2015   TRIG 99.0 02/22/2015   HDL 46.00 02/22/2015   LDLDIRECT 159.3 09/28/2014   LDLCALC 128* 02/22/2015   ALT 14 02/22/2015   AST 16 02/22/2015   NA 136 02/22/2015   K 4.1 02/22/2015   CL 102 02/22/2015   CREATININE 0.97 02/22/2015   BUN 17 02/22/2015   CO2 30 02/22/2015   TSH 2.47 09/28/2014   PSA 0.50 09/28/2014   HGBA1C 5.9 02/22/2015   MICROALBUR 2.2* 09/28/2014       Assessment & Plan:   Problem List Items Addressed This Visit    Diabetes mellitus    On insulin.  Sugars better.  Has adjusted his diet.  Check met b and a1c.  Discussed the need for opthalmology evaluation.        Relevant Medications   insulin detemir (LEVEMIR) 100 unit/ml SOLN   insulin lispro (HUMALOG KWIKPEN) 100 UNIT/ML KiwkPen   metFORMIN (GLUCOPHAGE) 1000 MG tablet   Other Relevant Orders   Hepatic function panel (Completed)   Hemoglobin A1c (Completed)   Basic metabolic panel (Completed)   GERD (gastroesophageal reflux disease)    On protonix bid.  Symptoms controlled.  If off protonix. Symptoms return.  Being referred to GI for colonoscopy.  Will have him evaluated for upper endoscopy as well.        Relevant Orders   Ambulatory referral to Gastroenterology   Health care maintenance    Schedule for a physical.  Refer to GI for screening colonoscopy.  PSA 09/28/14 - .50.        Hypercholesterolemia    Low cholesterol diet and exercise.  Follow lipid panel.  Check today.       Relevant Orders   Lipid panel (Completed)   Left shoulder pain - Primary    Check shoulder xray.  Further w/up and treatment pending results.        Relevant Orders   DG Shoulder Left (Completed)     I spent 25 minutes with the patient and more than 50% of the time was spent in consultation regarding the above.     Einar Pheasant, MD

## 2015-02-23 ENCOUNTER — Encounter: Payer: Self-pay | Admitting: Internal Medicine

## 2015-02-26 ENCOUNTER — Encounter: Payer: Self-pay | Admitting: Internal Medicine

## 2015-02-26 DIAGNOSIS — Z0000 Encounter for general adult medical examination without abnormal findings: Secondary | ICD-10-CM | POA: Insufficient documentation

## 2015-02-26 NOTE — Assessment & Plan Note (Signed)
On insulin.  Sugars better.  Has adjusted his diet.  Check met b and a1c.  Discussed the need for opthalmology evaluation.

## 2015-02-26 NOTE — Assessment & Plan Note (Signed)
Check shoulder xray.  Further w/up and treatment pending results.

## 2015-02-26 NOTE — Assessment & Plan Note (Signed)
Low cholesterol diet and exercise. Follow lipid panel.  Check today.  ?

## 2015-02-26 NOTE — Assessment & Plan Note (Signed)
On protonix bid.  Symptoms controlled.  If off protonix. Symptoms return.  Being referred to GI for colonoscopy.  Will have him evaluated for upper endoscopy as well.

## 2015-02-26 NOTE — Assessment & Plan Note (Signed)
Schedule for a physical.  Refer to GI for screening colonoscopy.  PSA 09/28/14 - .50.

## 2015-04-03 ENCOUNTER — Other Ambulatory Visit: Payer: Self-pay

## 2015-04-03 MED ORDER — PANTOPRAZOLE SODIUM 40 MG PO TBEC
40.0000 mg | DELAYED_RELEASE_TABLET | Freq: Every day | ORAL | Status: DC
Start: 2015-04-03 — End: 2015-10-25

## 2015-06-27 ENCOUNTER — Encounter: Payer: 59 | Admitting: Internal Medicine

## 2015-06-27 ENCOUNTER — Telehealth: Payer: Self-pay | Admitting: Internal Medicine

## 2015-06-27 NOTE — Telephone Encounter (Signed)
FYI, Pt wife called stating he will not be able to make his appt today :30 due to not having his copay. I advised wife that they could have been billed she states now she cannot get in touch with him at work now. She will call to reschedule the appt. Thank You!

## 2015-07-31 ENCOUNTER — Ambulatory Visit
Admission: RE | Admit: 2015-07-31 | Discharge: 2015-07-31 | Disposition: A | Discharge status: Home / Self Care | Payer: Commercial Managed Care - HMO | Source: Ambulatory Visit | Attending: Gastroenterology | Admitting: Gastroenterology

## 2015-07-31 ENCOUNTER — Ambulatory Visit: Payer: Commercial Managed Care - HMO | Admitting: Certified Registered Nurse Anesthetist

## 2015-07-31 ENCOUNTER — Encounter: Payer: Self-pay | Admitting: *Deleted

## 2015-07-31 ENCOUNTER — Encounter
Admission: RE | Disposition: A | Discharge status: Home / Self Care | Payer: Self-pay | Source: Ambulatory Visit | Attending: Gastroenterology

## 2015-07-31 DIAGNOSIS — K5730 Diverticulosis of large intestine without perforation or abscess without bleeding: Secondary | ICD-10-CM | POA: Diagnosis not present

## 2015-07-31 DIAGNOSIS — Z1211 Encounter for screening for malignant neoplasm of colon: Secondary | ICD-10-CM | POA: Diagnosis present

## 2015-07-31 DIAGNOSIS — Z7984 Long term (current) use of oral hypoglycemic drugs: Secondary | ICD-10-CM | POA: Diagnosis not present

## 2015-07-31 DIAGNOSIS — E119 Type 2 diabetes mellitus without complications: Secondary | ICD-10-CM | POA: Diagnosis not present

## 2015-07-31 DIAGNOSIS — I10 Essential (primary) hypertension: Secondary | ICD-10-CM | POA: Insufficient documentation

## 2015-07-31 DIAGNOSIS — K2960 Other gastritis without bleeding: Secondary | ICD-10-CM | POA: Insufficient documentation

## 2015-07-31 DIAGNOSIS — K648 Other hemorrhoids: Secondary | ICD-10-CM | POA: Insufficient documentation

## 2015-07-31 DIAGNOSIS — Z79899 Other long term (current) drug therapy: Secondary | ICD-10-CM | POA: Insufficient documentation

## 2015-07-31 DIAGNOSIS — R51 Headache: Secondary | ICD-10-CM | POA: Insufficient documentation

## 2015-07-31 DIAGNOSIS — M1990 Unspecified osteoarthritis, unspecified site: Secondary | ICD-10-CM | POA: Diagnosis not present

## 2015-07-31 DIAGNOSIS — Z794 Long term (current) use of insulin: Secondary | ICD-10-CM | POA: Insufficient documentation

## 2015-07-31 DIAGNOSIS — K210 Gastro-esophageal reflux disease with esophagitis: Secondary | ICD-10-CM | POA: Diagnosis not present

## 2015-07-31 HISTORY — PX: COLONOSCOPY WITH PROPOFOL: SHX5780

## 2015-07-31 HISTORY — PX: ESOPHAGOGASTRODUODENOSCOPY (EGD) WITH PROPOFOL: SHX5813

## 2015-07-31 LAB — HM COLONOSCOPY

## 2015-07-31 LAB — GLUCOSE, CAPILLARY: GLUCOSE-CAPILLARY: 100 mg/dL — AB (ref 65–99)

## 2015-07-31 SURGERY — COLONOSCOPY WITH PROPOFOL
Anesthesia: General

## 2015-07-31 MED ORDER — FENTANYL CITRATE (PF) 100 MCG/2ML IJ SOLN
INTRAMUSCULAR | Status: DC | PRN
  Administered 2015-07-31: 50 ug via INTRAVENOUS
  Administered 2015-07-31 (×2): 25 ug via INTRAVENOUS

## 2015-07-31 MED ORDER — MIDAZOLAM HCL 2 MG/2ML IJ SOLN
INTRAMUSCULAR | Status: DC | PRN
Start: 2015-07-31 — End: 2015-07-31
  Administered 2015-07-31: 2 mg via INTRAVENOUS

## 2015-07-31 MED ORDER — SODIUM CHLORIDE 0.9 % IV SOLN: INTRAVENOUS | Status: DC

## 2015-07-31 MED ORDER — LIDOCAINE HCL (CARDIAC) 20 MG/ML IV SOLN
INTRAVENOUS | Status: DC | PRN
  Administered 2015-07-31: 80 mg via INTRAVENOUS

## 2015-07-31 MED ORDER — PROPOFOL 10 MG/ML IV BOLUS
INTRAVENOUS | Status: DC | PRN
  Administered 2015-07-31: 30 mg via INTRAVENOUS

## 2015-07-31 MED ORDER — PROPOFOL 500 MG/50ML IV EMUL
INTRAVENOUS | Status: DC | PRN
  Administered 2015-07-31: 140 ug/kg/min via INTRAVENOUS

## 2015-07-31 MED ORDER — SODIUM CHLORIDE 0.9 % IV SOLN
INTRAVENOUS | Status: DC
  Administered 2015-07-31: 1000 mL via INTRAVENOUS

## 2015-07-31 NOTE — Op Note (Signed)
[facility] Gastroenterology Patient Name: Victor Wagner Procedure Date: 07/31/2015 11:28 AM MRN: [id number] Account #: [id number] Date of Birth: 1965-11-07 Admit Type: Outpatient Age: 49 Room: [facility] ENDO ROOM 3 Gender: Male Note Status: Finalized Procedure:         Upper GI endoscopy Indications:       Gastro-esophageal reflux disease Providers:         Christena Deem, MD Referring MD:      Dale [place], MD (Referring MD) Medicines:         Monitored Anesthesia Care Complications:     No immediate complications. Procedure:         Pre-Anesthesia Assessment:                    - ASA Grade Assessment: II - A patient with mild systemic                     disease.                    After obtaining informed consent, the endoscope was passed                     under direct vision. Throughout the procedure, the                     patient's blood pressure, pulse, and oxygen saturations                     were monitored continuously. The Olympus GIF-160 endoscope                     (S#. [id number]) was introduced through the mouth, and                     advanced to the third part of duodenum. The upper GI                     endoscopy was accomplished without difficulty. The patient                     tolerated the procedure well. Findings:      The Z-line was variable. Biopsies were taken with a cold forceps for       histology.      Patchy minimal inflammation characterized by adherent blood and erythema       was found in the gastric body. Biopsies were taken with a cold forceps       for histology. Biopsies were taken with a cold forceps for Helicobacter       pylori testing.      The exam of the stomach was otherwise normal.      Patchy moderate inflammation characterized by congestion (edema) and       erythema was found in the duodenal bulb and in the second part of the       duodenum.      The exam of the duodenum was otherwise normal.      The  cardia and gastric fundus were normal on retroflexion. Impression:        - Z-line variable. Biopsied.                    - Erosive gastritis. Biopsied.                    -  Duodenitis. Recommendation:    - Use Protonix (pantoprazole) 40 mg PO BID for 2 weeks.                    - Use Protonix (pantoprazole) 40 mg PO daily daily.                    - Await pathology results.                    - Return to GI clinic in 1 month. Procedure Code(s): --- Professional ---                    [phone], Esophagogastroduodenoscopy, flexible, transoral;                     with biopsy, single or multiple Diagnosis Code(s): --- Professional ---                    530.89, Other specified disorders of esophagus                    535.40, Other specified gastritis, without mention of                     hemorrhage                    535.60, Duodenitis, without mention of hemorrhage                    530.81, Esophageal reflux CPT copyright 2014 American Medical Association. All rights reserved. The codes documented in this report are preliminary and upon coder review may  be revised to meet current compliance requirements. Christena DeemMartin U Maleyah Evans, MD 07/31/2015 11:47:23 AM This report has been signed electronically. Number of Addenda: 0 Note Initiated On: 07/31/2015 11:28 AM      [facility]

## 2015-07-31 NOTE — Transfer of Care (Signed)
Immediate Anesthesia Transfer of Care Note  Patient: Victor Wagner  Procedure(s) Performed: Procedure(s): COLONOSCOPY WITH PROPOFOL (N/A) ESOPHAGOGASTRODUODENOSCOPY (EGD) WITH PROPOFOL (N/A)  Patient Location:  PACU  Anesthesia Type:General  Level of Consciousness: sedated  Airway & Oxygen Therapy: Patient Spontanous Breathing and Patient connected to nasal cannula oxygen  Post-op Assessment: Report given to RN and Post -op Vital signs reviewed and stable  Post vital signs: Reviewed and stable  Last Vitals:  Filed Vitals:   07/31/15 1211  BP: 87/53  Pulse: 60  Temp: 36.3 C  Resp: 23    Complications: No apparent anesthesia complications

## 2015-07-31 NOTE — Anesthesia Procedure Notes (Signed)
Procedure Name: MAC Date/Time: 07/31/2015 11:30 AM Performed by: Ginger CarneMICHELET, Vieva Brummitt Pre-anesthesia Checklist: Patient identified, Emergency Drugs available, Suction available, Patient being monitored and Timeout performed Patient Re-evaluated:Patient Re-evaluated prior to inductionOxygen Delivery Method: Nasal cannula

## 2015-07-31 NOTE — Op Note (Signed)
[facility] Gastroenterology Patient Name: Victor Wagner Procedure Date: 07/31/2015 11:28 AM MRN: [id number] Account #: [id number] Date of Birth: Aug 09, 1966 Admit Type: Outpatient Age: 49 Room: [facility] ENDO ROOM 3 Gender: Male Note Status: Finalized Procedure:         Colonoscopy Indications:       Screening for colorectal malignant neoplasm Providers:         Christena Deem, MD Referring MD:      Dale [place], MD (Referring MD) Medicines:         Monitored Anesthesia Care Complications:     No immediate complications. Procedure:         Pre-Anesthesia Assessment:                    - ASA Grade Assessment: II - A patient with mild systemic                     disease.                    After obtaining informed consent, the colonoscope was                     passed under direct vision. Throughout the procedure, the                     patient's blood pressure, pulse, and oxygen saturations                     were monitored continuously. The Colonoscope was                     introduced through the anus and advanced to the the cecum,                     identified by appendiceal orifice and ileocecal valve. The                     colonoscopy was performed without difficulty. The patient                     tolerated the procedure well. The quality of the bowel                     preparation was good. Findings:      Multiple small-mouthed diverticula were found in the sigmoid colon, in       the descending colon and in the transverse colon.      Non-bleeding internal hemorrhoids were found during retroflexion. The       hemorrhoids were small.      The digital rectal exam was normal. Impression:        - Diverticulosis in the sigmoid colon and in the                     descending colon.                    - Non-bleeding internal hemorrhoids.                    - No specimens collected. Recommendation:    - Repeat colonoscopy in 10 years for screening  purposes. Procedure Code(s): --- Professional ---                    [phone], Colonoscopy, flexible;  diagnostic, including                     collection of specimen(s) by brushing or washing, when                     performed (separate procedure) Diagnosis Code(s): --- Professional ---                    V76.51, Special screening for malignant neoplasms of colon                    455.0, Internal hemorrhoids without mention of complication                    562.10, Diverticulosis of colon (without mention of                     hemorrhage) CPT copyright 2014 American Medical Association. All rights reserved. The codes documented in this report are preliminary and upon coder review may  be revised to meet current compliance requirements. Christena DeemMartin U [place]ie, MD 07/31/2015 12:07:15 PM This report has been signed electronically. Number of Addenda: 0 Note Initiated On: 07/31/2015 11:28 AM Scope Withdrawal Time: 0 hours 8 minutes 15 seconds  Total Procedure Duration: 0 hours 14 minutes 25 seconds       [facility]

## 2015-07-31 NOTE — H&P (Addendum)
Outpatient short stay form Pre-procedure 07/31/2015 11:21 AM Christena DeemMartin U Skulskie MD  Primary Physician: Dr. Dale Durhamharlene Scott  Reason for visit:  EGD and colonoscopy  History of present illness:  Patient is a 49 year old male presenting for evaluation of reflux refractive to medication use and colon screening. He is never had a colonoscopy before. In the past she has used pattern aspirin products. he currently uses oral tobacco. He tolerated his prep well. He takes no other aspirin products or blood thinning agents.    Current facility-administered medications:  .  0.9 %  sodium chloride infusion, , Intravenous, Continuous, Christena DeemMartin U [place]ul[place]ie, MD, Last Rate: 20 mL/hr at 07/31/15 1044, 1,000 mL at 07/31/15 1044 .  0.9 %  sodium chloride infusion, , Intravenous, Continuous, Christena DeemMartin U [place]ul[place]ie, MD  Prescriptions prior to admission  Medication Sig Dispense Refill Last Dose  . insulin detemir (LEVEMIR) 100 unit/ml SOLN Inject 0.2 mLs (20 Units total) into the skin daily. 1 vial 2   . insulin lispro (HUMALOG KWIKPEN) 100 UNIT/ML KiwkPen Use 5 units [place] at lunch time and at supper. 15 mL 3   . Insulin Pen Needle 32G X 4 MM MISC To be used once daily to inject insulin into skin 100 each 2   . metFORMIN (GLUCOPHAGE) 1000 MG tablet Take 1 tablet (1,000 mg total) by mouth 2 (two) times daily with a meal. 60 tablet 5   . naproxen sodium (ANAPROX) 220 MG tablet Take 220 mg by mouth as needed.   Taking  . ONETOUCH VERIO test strip 1 each by Other route 2 (two) times daily. for testing 100 each 11 Taking  . pantoprazole (PROTONIX) 40 MG tablet Take 1 tablet (40 mg total) by mouth daily. 30 tablet 6      No Known Allergies   Past Medical History  Diagnosis Date  . Frequent headaches     H/O  . Arthritis   . GERD (gastroesophageal reflux disease)   . Hypertension   . Diabetes mellitus (HCC)     Review of systems:      Physical Exam    Heart and lungs: Regular rate and rhythm without rub or  gallop, lungs are bilaterally clear    HEENT: Normocephalic atraumatic eyes are anicteric    Other:     Pertinant exam for procedure: Soft, nontender nondistended bowel sounds positive normoactive    Planned proceedures: EGD and colonoscopy with indicated procedures I have discussed the risks benefits and complications of procedures to include not limited to bleeding, infection, perforation and the risk of sedation and the patient wishes to proceed.    Christena DeemMartin U Skulskie, MD Gastroenterology 07/31/2015  11:21 AM

## 2015-07-31 NOTE — Anesthesia Preprocedure Evaluation (Signed)
Anesthesia Evaluation  Patient identified by MRN, date of birth, ID band Patient awake    Reviewed: Allergy & Precautions, H&P , NPO status , Patient's Chart, lab work & pertinent test results, reviewed documented beta blocker date and time   History of Anesthesia Complications Negative for: history of anesthetic complications  Airway Mallampati: IV  TM Distance: >3 FB Neck ROM: full    Dental no notable dental hx. (+) Missing, Poor Dentition   Pulmonary neg pulmonary ROS,    Pulmonary exam normal breath sounds clear to auscultation       Cardiovascular Exercise Tolerance: Good hypertension, (-) angina(-) CAD, (-) Past MI, (-) Cardiac Stents and (-) CABG Normal cardiovascular exam(-) dysrhythmias (-) Valvular Problems/Murmurs Rhythm:regular Rate:Normal     Neuro/Psych  Headaches, neg Seizures negative psych ROS   GI/Hepatic Neg liver ROS, GERD  Medicated and Controlled,  Endo/Other  diabetes, Well Controlled, Insulin Dependent, Oral Hypoglycemic Agents  Renal/GU negative Renal ROS  negative genitourinary   Musculoskeletal   Abdominal   Peds  Hematology negative hematology ROS (+)   Anesthesia Other Findings Past Medical History:   Frequent headaches                                             Comment:H/O   Arthritis                                                    GERD (gastroesophageal reflux disease)                       Hypertension                                                 Diabetes mellitus (HCC)                                      Reproductive/Obstetrics negative OB ROS                             Anesthesia Physical Anesthesia Plan  ASA: II  Anesthesia Plan: General   Post-op Pain Management:    Induction:   Airway Management Planned:   Additional Equipment:   Intra-op Plan:   Post-operative Plan:   Informed Consent: I have reviewed the patients History  and Physical, chart, labs and discussed the procedure including the risks, benefits and alternatives for the proposed anesthesia with the patient or authorized representative who has indicated his/her understanding and acceptance.   Dental Advisory Given  Plan Discussed with: Anesthesiologist, CRNA and Surgeon  Anesthesia Plan Comments:         Anesthesia Quick Evaluation

## 2015-07-31 NOTE — Anesthesia Postprocedure Evaluation (Signed)
  Anesthesia Post-op Note  Patient: Abelardo DieselRoger D Pellerito  Procedure(s) Performed: Procedure(s): COLONOSCOPY WITH PROPOFOL (N/A) ESOPHAGOGASTRODUODENOSCOPY (EGD) WITH PROPOFOL (N/A)  Anesthesia type:General  Patient location: PACU  Post pain: Pain level controlled  Post assessment: Post-op Vital signs reviewed, Patient's Cardiovascular Status Stable, Respiratory Function Stable, Patent Airway and No signs of Nausea or vomiting  Post vital signs: Reviewed and stable  Last Vitals:  Filed Vitals:   07/31/15 1240  BP: 103/80  Pulse: 58  Temp:   Resp: 20    Level of consciousness: awake, alert  and patient cooperative  Complications: No apparent anesthesia complications

## 2015-08-01 ENCOUNTER — Encounter: Payer: Self-pay | Admitting: Gastroenterology

## 2015-08-01 LAB — SURGICAL PATHOLOGY

## 2015-08-25 ENCOUNTER — Encounter: Payer: Self-pay | Admitting: Internal Medicine

## 2015-09-05 ENCOUNTER — Other Ambulatory Visit: Payer: Self-pay | Admitting: Internal Medicine

## 2015-09-11 ENCOUNTER — Emergency Department: Payer: Commercial Managed Care - HMO

## 2015-09-11 ENCOUNTER — Emergency Department
Admission: EM | Admit: 2015-09-11 | Discharge: 2015-09-11 | Disposition: A | Discharge status: Home / Self Care | Payer: Commercial Managed Care - HMO | Attending: Student | Admitting: Student

## 2015-09-11 ENCOUNTER — Encounter: Payer: Self-pay | Admitting: Urgent Care

## 2015-09-11 DIAGNOSIS — R52 Pain, unspecified: Secondary | ICD-10-CM

## 2015-09-11 DIAGNOSIS — R109 Unspecified abdominal pain: Secondary | ICD-10-CM

## 2015-09-11 DIAGNOSIS — N200 Calculus of kidney: Secondary | ICD-10-CM | POA: Diagnosis not present

## 2015-09-11 DIAGNOSIS — E119 Type 2 diabetes mellitus without complications: Secondary | ICD-10-CM | POA: Insufficient documentation

## 2015-09-11 DIAGNOSIS — Z7984 Long term (current) use of oral hypoglycemic drugs: Secondary | ICD-10-CM | POA: Insufficient documentation

## 2015-09-11 DIAGNOSIS — Z79899 Other long term (current) drug therapy: Secondary | ICD-10-CM | POA: Insufficient documentation

## 2015-09-11 DIAGNOSIS — I10 Essential (primary) hypertension: Secondary | ICD-10-CM | POA: Diagnosis not present

## 2015-09-11 LAB — BASIC METABOLIC PANEL
ANION GAP: 7 (ref 5–15)
BUN: 23 mg/dL — ABNORMAL HIGH (ref 6–20)
CHLORIDE: 104 mmol/L (ref 101–111)
CO2: 28 mmol/L (ref 22–32)
Calcium: 8.9 mg/dL (ref 8.9–10.3)
Creatinine, Ser: 1.61 mg/dL — ABNORMAL HIGH (ref 0.61–1.24)
GFR calc non Af Amer: 48 mL/min — ABNORMAL LOW (ref >60)
GFR, EST AFRICAN AMERICAN: 56 mL/min — AB (ref >60)
Glucose, Bld: 106 mg/dL — ABNORMAL HIGH (ref 65–99)
POTASSIUM: 3.8 mmol/L (ref 3.5–5.1)
SODIUM: 139 mmol/L (ref 135–145)

## 2015-09-11 LAB — URINALYSIS COMPLETE WITH MICROSCOPIC (ARMC ONLY)
BACTERIA UA: NONE SEEN
BILIRUBIN URINE: NEGATIVE
Glucose, UA: NEGATIVE mg/dL
Nitrite: NEGATIVE
PH: 6 (ref 5.0–8.0)
PROTEIN: 30 mg/dL — AB
Specific Gravity, Urine: 1.025 (ref 1.005–1.030)

## 2015-09-11 LAB — HEPATIC FUNCTION PANEL
ALBUMIN: 4.3 g/dL (ref 3.5–5.0)
ALT: 18 U/L (ref 17–63)
AST: 18 U/L (ref 15–41)
Alkaline Phosphatase: 70 U/L (ref 38–126)
BILIRUBIN TOTAL: 0.6 mg/dL (ref 0.3–1.2)
Bilirubin, Direct: <0.1 mg/dL — ABNORMAL LOW (ref 0.1–0.5)
Total Protein: 7.6 g/dL (ref 6.5–8.1)

## 2015-09-11 LAB — CBC
HEMATOCRIT: 42 % (ref 40.0–52.0)
HEMOGLOBIN: 14 g/dL (ref 13.0–18.0)
MCH: 31 pg (ref 26.0–34.0)
MCHC: 33.3 g/dL (ref 32.0–36.0)
MCV: 93 fL (ref 80.0–100.0)
PLATELETS: 219 10*3/uL (ref 150–440)
RBC: 4.52 MIL/uL (ref 4.40–5.90)
RDW: 12.7 % (ref 11.5–14.5)
WBC: 9.3 10*3/uL (ref 3.8–10.6)

## 2015-09-11 LAB — LIPASE, BLOOD: LIPASE: 20 U/L (ref 11–51)

## 2015-09-11 MED ORDER — TAMSULOSIN HCL 0.4 MG PO CAPS: 0.4000 mg | ORAL_CAPSULE | Freq: Every day | ORAL | Status: DC

## 2015-09-11 MED ORDER — KETOROLAC TROMETHAMINE 60 MG/2ML IM SOLN
60.0000 mg | Freq: Once | INTRAMUSCULAR | Status: AC
  Administered 2015-09-11: 60 mg via INTRAMUSCULAR
  Filled 2015-09-11: qty 2

## 2015-09-11 MED ORDER — OXYCODONE HCL 5 MG PO TABS: 5.0000 mg | ORAL_TABLET | Freq: Four times a day (QID) | ORAL | Status: DC | PRN

## 2015-09-11 MED ORDER — ONDANSETRON 4 MG PO TBDP
4.0000 mg | ORAL_TABLET | Freq: Three times a day (TID) | ORAL | Status: DC | PRN
Start: 2015-09-11 — End: 2022-01-15

## 2015-09-11 NOTE — ED Notes (Signed)
Patient presents with c/o lower abdominal pain with (+) radiation into RIGHT flank area since yesterday. (+) N/V yesterday; resolved today. (+) dysuria since onset. No h/o urolithiasis.

## 2015-09-11 NOTE — ED Notes (Signed)

## 2015-09-11 NOTE — ED Notes (Signed)
Pt presents to ED with c/o mid abdominal pain radiating to right flank area since yesterday. Pt reports pain went away yesterday but came back after eating breakfast today. Pt denies hx of kidney stones or gallbladder stones. Pt denies pain at this time, pt reports feeling diaphoretic at time of pain, skin warm and dry. Pt denies chest pain and shortness of breath. Pt alert and oriented x 4, calm and cooperative, no increased work in breathing noted.

## 2015-09-11 NOTE — ED Provider Notes (Signed)
[facility] Emergency Department Provider Note  ____________________________________________  Time seen: Approximately 8:42 PM  I have reviewed the triage vital signs and the nursing notes.   HISTORY  Chief Complaint Flank Pain    HPI Victor Wagner is a 49 y.o. male with history of hypertension and diabetes who presents for evaluation of intermittent right flank pain and right abdominal pain since yesterday, gradual onset, intermittent, was severe at its maximum but currently mild. No modifying factors. Patient reports that the flank pain wraps around to the abdomen. He has had several episodes of nonbloody nonbilious emesis as well as dysuria today. No diarrhea. No chest pain or difficulty breathing. No fevers.   Past Medical History  Diagnosis Date  . Frequent headaches     H/O  . Arthritis   . GERD (gastroesophageal reflux disease)   . Hypertension   . Diabetes mellitus [facility])     Patient Active Problem List   Diagnosis Date Noted  . Health care maintenance 02/26/2015  . Left shoulder pain 02/22/2015  . Hypercholesterolemia 10/09/2014  . GERD (gastroesophageal reflux disease) 10/02/2014  . Diabetes mellitus (HCC) 09/28/2014  . Fatigue 09/28/2014  . Light headedness 09/28/2014    Past Surgical History  Procedure Laterality Date  . Hernia repair  1984  . Vasectomy    . Colonoscopy with propofol N/A 07/31/2015    Procedure: COLONOSCOPY WITH PROPOFOL;  Surgeon: Christena Deem, MD;  Location: [facility] ENDOSCOPY;  Service: Endoscopy;  Laterality: N/A;  . Esophagogastroduodenoscopy (egd) with propofol N/A 07/31/2015    Procedure: ESOPHAGOGASTRODUODENOSCOPY (EGD) WITH PROPOFOL;  Surgeon: Christena Deem, MD;  Location: [facility] ENDOSCOPY;  Service: Endoscopy;  Laterality: N/A;    Current Outpatient Rx  Name  Route  Sig  Dispense  Refill  . insulin detemir (LEVEMIR) 100 unit/ml SOLN   Subcutaneous   Inject 0.2 mLs (20 Units total) into the skin  daily.   1 vial   2   . insulin lispro (HUMALOG KWIKPEN) 100 UNIT/ML KiwkPen      Use 5 units [place] at lunch time and at supper.   15 mL   3   . Insulin Pen Needle 32G X 4 MM MISC      To be used once daily to inject insulin into skin   100 each   2   . metFORMIN (GLUCOPHAGE) 1000 MG tablet      take 1 tablet by mouth twice a day with meals   60 tablet   5   . naproxen sodium (ANAPROX) 220 MG tablet   Oral   Take 220 mg by mouth as needed.         . ondansetron (ZOFRAN ODT) 4 MG disintegrating tablet   Oral   Take 1 tablet (4 mg total) by mouth every 8 (eight) hours as needed for nausea or vomiting.   12 tablet   0   . ONETOUCH VERIO test strip   Other   1 each by Other route 2 (two) times daily. for testing   100 each   11     Dispense as written.   Marland Kitchen oxyCODONE (ROXICODONE) 5 MG immediate release tablet   Oral   Take 1 tablet (5 mg total) by mouth every 6 (six) hours as needed for moderate pain. Do not drive while taking this medication.   12 tablet   0   . pantoprazole (PROTONIX) 40 MG tablet   Oral   Take 1 tablet (40 mg total)  by mouth daily.   30 tablet   6   . tamsulosin (FLOMAX) 0.4 MG CAPS capsule   Oral   Take 1 capsule (0.4 mg total) by mouth daily.   14 capsule   0     Allergies Review of patient's allergies indicates no known allergies.  Family History  Problem Relation Age of Onset  . Arthritis Father   . Heart disease Father   . Hypertension Father   . Diabetes Father   . Heart disease Maternal Grandfather   . Diabetes Sister     Social History Social History  Substance Use Topics  . Smoking status: Never Smoker   . Smokeless tobacco: Current User    Types: Snuff, Chew  . Alcohol Use: 0.0 oz/week    0 Standard drinks or equivalent per week    Review of Systems Constitutional: No fever/chills Eyes: No visual changes. ENT: No sore throat. Cardiovascular: Denies chest pain. Respiratory: Denies shortness of  breath. Gastrointestinal: + abdominal pain.  + nausea, + vomiting.  No diarrhea.  No constipation. Genitourinary: Positive for dysuria. Musculoskeletal: Positive for flank pain. Skin: Negative for rash. Neurological: Negative for headaches, focal weakness or numbness.  10-point ROS otherwise negative.  ____________________________________________   PHYSICAL EXAM:  VITAL SIGNS: ED Triage Vitals  Enc Vitals Group     BP 09/11/15 1913 150/91 mmHg     Pulse Rate 09/11/15 1913 81     Resp 09/11/15 1913 16     Temp 09/11/15 1913 98.7 F (37.1 C)     Temp Source 09/11/15 1913 Oral     SpO2 09/11/15 1913 98 %     Weight 09/11/15 1913 210 lb (95.255 kg)     Height 09/11/15 1913 5\' 10"  (1.778 m)     Head Cir --      Peak Flow --      Pain Score 09/11/15 1913 2     Pain Loc --      Pain Edu? --      Excl. in GC? --     Constitutional: Alert and oriented. Well appearing and in no acute distress. Eyes: Conjunctivae are normal. PERRL. EOMI. Head: Atraumatic. Nose: No congestion/rhinnorhea. Mouth/Throat: Mucous membranes are moist.  Oropharynx non-erythematous. Neck: No stridor.   Cardiovascular: Normal rate, regular rhythm. Grossly normal heart sounds.  Good peripheral circulation. Respiratory: Normal respiratory effort.  No retractions. Lungs CTAB. Gastrointestinal: Soft and nontender. No distention. No abdominal bruits. No CVA tenderness. Genitourinary: deferred Musculoskeletal: No lower extremity tenderness nor edema.  No joint effusions. Neurologic:  Normal speech and language. No gross focal neurologic deficits are appreciated. No gait instability. Skin:  Skin is warm, dry and intact. No rash noted. Psychiatric: Mood and affect are normal. Speech and behavior are normal.  ____________________________________________   LABS (all labs ordered are listed, but only abnormal results are displayed)  Labs Reviewed  BASIC METABOLIC PANEL - Abnormal; Notable for the following:     Glucose, Bld 106 (*)    BUN 23 (*)    Creatinine, Ser 1.61 (*)    GFR calc non Af Amer 48 (*)    GFR calc Af Amer 56 (*)    All other components within normal limits  URINALYSIS COMPLETEWITH MICROSCOPIC ([facility] ONLY) - Abnormal; Notable for the following:    Color, Urine YELLOW (*)    APPearance CLEAR (*)    Ketones, ur 1+ (*)    Hgb urine dipstick 2+ (*)    Protein, ur 30 (*)  Leukocytes, UA TRACE (*)    Squamous Epithelial / LPF 0-5 (*)    All other components within normal limits  HEPATIC FUNCTION PANEL - Abnormal; Notable for the following:    Bilirubin, Direct <0.1 (*)    All other components within normal limits  CBC  LIPASE, BLOOD   ____________________________________________  EKG  none ____________________________________________  RADIOLOGY  CT Abdomen and Pelvis  IMPRESSION: 1. Right hydroureteronephrosis from 3 mm UVJ calculus. 2. Extensive colonic diverticulosis. ____________________________________________   PROCEDURES  Procedure(s) performed: None  Critical Care performed: No  ____________________________________________   INITIAL IMPRESSION / ASSESSMENT AND PLAN / ED COURSE  Pertinent labs & imaging results that were available during my care of the patient were reviewed by me and considered in my medical decision making (see chart for details).  Victor Wagner is a 49 y.o. male with history of hypertension and diabetes who presents for evaluation of intermittent right flank pain and right abdominal pain since yesterday. At this time, he reports his pain has nearly completely resolved without any intervention. Vital signs stable, he is afebrile. He appears well with a benign examination. Labs reviewed. Normal CBC, normal liver function tests, normal lipase. BMP with a limited BUN and mild elevation of creatinine at 1.61. Baseline creatinine is 0.97, I have encouraged him to orally hydrate as I suspect this is secondary to mild dehydration.  Urinalysis was notable for multiple red blood cells and white blood cells. CT scan obtained and shows 3 mm right UVJ calculus with hydroureteronephrosis. Pain well controlled, no fevers, no leukocytosis, doubt infected stone. We discussed return precautions, we'll discharge with expectant management and close urology follow-up. He is comfortable with the discharge. ____________________________________________   FINAL CLINICAL IMPRESSION(S) / ED DIAGNOSES  Final diagnoses:  Kidney stone  Flank pain, acute      Gayla Doss, MD 09/11/15 2155

## 2015-09-14 ENCOUNTER — Encounter: Payer: Self-pay | Admitting: *Deleted

## 2015-09-21 ENCOUNTER — Telehealth: Payer: Self-pay | Admitting: Internal Medicine

## 2015-09-21 NOTE — Telephone Encounter (Signed)
Lm on pt vm to schedule a follow up appt with Dr. Lorin PicketScott.. Please schedule when pt calls back

## 2015-10-20 ENCOUNTER — Encounter: Payer: Self-pay | Admitting: Internal Medicine

## 2015-10-23 NOTE — Telephone Encounter (Signed)
Please call pt and notify pt that if he is having increased issues, we can increase the protonix to bid.  I reviewed the chart.  I have not seen him since 02/2015.  He saw GI.  They did upper endoscopy - 07/2015.  They advised protonix bid for two weeks and then back to protonix q day.  This is the instructions written on EGD report.  On the report, it also states that he was to f/u in GI in one month - which would have been 08/2015.  So, can increase protonix to bid if needed, but needs GI appt for evaluation of persistent reflux - if "miserable".

## 2015-10-24 ENCOUNTER — Other Ambulatory Visit: Payer: Self-pay | Admitting: Internal Medicine

## 2015-10-25 ENCOUNTER — Other Ambulatory Visit: Payer: Self-pay | Admitting: *Deleted

## 2015-10-25 MED ORDER — PANTOPRAZOLE SODIUM 40 MG PO TBEC: DELAYED_RELEASE_TABLET | ORAL | Status: DC

## 2015-11-28 ENCOUNTER — Ambulatory Visit (INDEPENDENT_AMBULATORY_CARE_PROVIDER_SITE_OTHER): Payer: Commercial Managed Care - HMO | Admitting: Internal Medicine

## 2015-11-28 ENCOUNTER — Encounter: Payer: Self-pay | Admitting: Internal Medicine

## 2015-11-28 VITALS — BP 120/80 | HR 77 | Temp 98.3°F | Resp 18 | Ht 68.0 in | Wt 202.2 lb

## 2015-11-28 DIAGNOSIS — K219 Gastro-esophageal reflux disease without esophagitis: Secondary | ICD-10-CM | POA: Diagnosis not present

## 2015-11-28 DIAGNOSIS — R0781 Pleurodynia: Secondary | ICD-10-CM | POA: Diagnosis not present

## 2015-11-28 DIAGNOSIS — E119 Type 2 diabetes mellitus without complications: Secondary | ICD-10-CM

## 2015-11-28 DIAGNOSIS — E7800 Pure hypercholesterolemia, unspecified: Secondary | ICD-10-CM

## 2015-11-28 DIAGNOSIS — Z794 Long term (current) use of insulin: Secondary | ICD-10-CM

## 2015-11-28 DIAGNOSIS — M25572 Pain in left ankle and joints of left foot: Secondary | ICD-10-CM | POA: Diagnosis not present

## 2015-11-28 DIAGNOSIS — N1330 Unspecified hydronephrosis: Secondary | ICD-10-CM

## 2015-11-28 MED ORDER — PANTOPRAZOLE SODIUM 40 MG PO TBEC: DELAYED_RELEASE_TABLET | ORAL | Status: AC

## 2015-11-28 NOTE — Progress Notes (Signed)
Pre-visit discussion using our clinic review tool. No additional management support is needed unless otherwise documented below in the visit note.  

## 2015-11-28 NOTE — Progress Notes (Signed)
Patient ID: Victor Wagner, male   DOB: Nov 28, 1965, 50 y.o.   MRN: [id number]   Subjective:    Patient ID: Victor Wagner, male    DOB: Jul 24, 1966, 50 y.o.   MRN: [id number]  HPI  Patient with past history of hypercholesterolemia, diabetes and hypertension.  He comes in today for a scheduled follow up.  He is accompanied by his wife.  History obtained from both of them.  States his sugars have been doing well. Not checking his sugar.  Taking insulin.  Had kidney stone.  Went to ER.  CT reviewed.  Right hydronephrosis.  Did not f/u with urology.   Passed stone.  Has noticed some persistent rib pain.  States heard something pop.  Has also noticed some left ankle pain and minimal swelling.  No know injury.  No cardiac symptoms with increased activity or exertion.  Breathing stable.     Past Medical History  Diagnosis Date  . Frequent headaches     H/O  . Arthritis   . GERD (gastroesophageal reflux disease)   . Hypertension   . Diabetes mellitus [facility])    Past Surgical History  Procedure Laterality Date  . Hernia repair  1984  . Vasectomy    . Colonoscopy with propofol N/A 07/31/2015    Procedure: COLONOSCOPY WITH PROPOFOL;  Surgeon: Christena Deem, MD;  Location[facility] ENDOSCOPY;  Service: Endoscopy;  Laterality: N/A;  . Esophagogastroduodenoscopy (egd) with propofol N/A 07/31/2015    Procedure: ESOPHAGOGASTRODUODENOSCOPY (EGD) WITH PROPOFOL;  Surgeon: Christena Deem, MD;  Location: [facility] ENDOSCOPY;  Service: Endoscopy;  Laterality: N/A;   Family History  Problem Relation Age of Onset  . Arthritis Father   . Heart disease Father   . Hypertension Father   . Diabetes Father   . Heart disease Maternal Grandfather   . Diabetes Sister    Social History   Social History  . Marital Status: Married    Spouse Name: N/A  . Number of Children: N/A  . Years of Education: N/A   Social History Main Topics  . Smoking status: Never Smoker   . Smokeless tobacco: Current User    Types:  Snuff, Chew  . Alcohol Use: 0.0 oz/week    0 Standard drinks or equivalent per week  . Drug Use: None  . Sexual Activity: Not Asked   Other Topics Concern  . None   Social History Narrative     Review of Systems  Constitutional: Negative for appetite change and unexpected weight change.  HENT: Negative for congestion and sinus pressure.   Respiratory: Negative for cough, chest tightness and shortness of breath.   Cardiovascular: Negative for chest pain, palpitations and leg swelling.  Gastrointestinal: Negative for nausea, vomiting, abdominal pain and diarrhea.  Genitourinary: Negative for dysuria and difficulty urinating.  Musculoskeletal: Negative for back pain.       Does report left ankle pain and swelling and rib pain.    Skin: Negative for color change and rash.  Neurological: Negative for dizziness, light-headedness and headaches.  Psychiatric/Behavioral: Negative for dysphoric mood and agitation.       Objective:    Physical Exam  Constitutional: He appears well-developed and well-nourished. No distress.  HENT:  Nose: Nose normal.  Mouth/Throat: Oropharynx is clear and moist.  Eyes: Conjunctivae are normal. Right eye exhibits no discharge. Left eye exhibits no discharge.  Neck: Neck supple. No thyromegaly present.  Cardiovascular: Normal rate and regular rhythm.   Pulmonary/Chest: Effort normal and breath sounds  normal. No respiratory distress.  Abdominal: Soft. Bowel sounds are normal. There is no tenderness.  Musculoskeletal: He exhibits no edema or tenderness.  Some discomfort to palpation over lower anterior ribs.  Minimal soft tissue swelling left ankle.  No significant tenderness.    Lymphadenopathy:    He has no cervical adenopathy.  Skin: No rash noted. No erythema.  Psychiatric: He has a normal mood and affect. His behavior is normal.    BP 120/80 mmHg  Pulse 77  Temp(Src) 98.3 F (36.8 C) (Oral)  Resp 18  Ht 5\' 8"  (1.727 m)  Wt 202 lb 4 oz  (91.74 kg)  BMI 30.76 kg/m2  SpO2 97% Wt Readings from Last 3 Encounters:  11/28/15 202 lb 4 oz (91.74 kg)  09/11/15 210 lb (95.255 kg)  07/31/15 210 lb (95.255 kg)     Lab Results  Component Value Date   WBC 9.3 09/11/2015   HGB 14.0 09/11/2015   HCT 42.0 09/11/2015   PLT 219 09/11/2015   GLUCOSE 106* 09/11/2015   CHOL 194 02/22/2015   TRIG 99.0 02/22/2015   HDL 46.00 02/22/2015   LDLDIRECT 159.3 09/28/2014   LDLCALC 128* 02/22/2015   ALT 18 09/11/2015   AST 18 09/11/2015   NA 139 09/11/2015   K 3.8 09/11/2015   CL 104 09/11/2015   CREATININE 1.61* 09/11/2015   BUN 23* 09/11/2015   CO2 28 09/11/2015   TSH 2.47 09/28/2014   PSA 0.50 09/28/2014   HGBA1C 5.9 02/22/2015   MICROALBUR 2.2* 09/28/2014    Ct Renal Stone Study  09/11/2015  CLINICAL DATA:  Lower abdominal pain radiating to the right. EXAM: CT ABDOMEN AND PELVIS WITHOUT CONTRAST TECHNIQUE: Multidetector CT imaging of the abdomen and pelvis was performed following the standard protocol without IV contrast. COMPARISON:  None. FINDINGS: Lower chest and abdominal wall: Fatty expansion of the bilateral inguinal canal. Incidental subcutaneous shrapnel in the left lower chest wall. Hepatobiliary: No focal liver abnormality.No evidence of biliary obstruction or stone. Pancreas: Unremarkable. Spleen: Unremarkable. Adrenals/Urinary Tract:  Negative adrenals. Mild to moderate right hydroureteronephrosis with perinephric edema secondary to a recently passed ureteral stone measuring 3 mm, located in the right bladder, but likely still within the UVJ given the hydronephrosis. No additional urolithiasis. Reproductive:No pathologic findings. Stomach/Bowel: Diffuse colonic diverticulosis. Mid duodenal diverticulum. No bowel inflammation or obstruction. Vascular/Lymphatic: No acute vascular abnormality. No mass or adenopathy. Peritoneal: No ascites or pneumoperitoneum. Musculoskeletal: No acute abnormalities. IMPRESSION: 1. Right  hydroureteronephrosis from 3 mm UVJ calculus. 2. Extensive colonic diverticulosis. Electronically Signed   By: Marnee SpringJonathon  Watts M.D.   On: 09/11/2015 21:08       Assessment & Plan:   Problem List Items Addressed This Visit    Diabetes mellitus (HCC)    On insulin.  Not checking sugars.  States were stable and stopped checking.  Taking 20 units of levemir q am and 4 units of humalog q supper.  No low sugars.  Discussed diet and exercise.  Check metabolic panel and a1c.        Relevant Orders   Hemoglobin A1c   Basic metabolic panel   TSH   Microalbumin / creatinine urine ratio   GERD (gastroesophageal reflux disease)    On protonix.  Upper symptoms controlled if take.        Relevant Medications   pantoprazole (PROTONIX) 40 MG tablet   Hydronephrosis, right    Noted on CT scan when evaluated in ER for kidney stone.  Check kidney function  tests.  Refer to urology.  Never f/u with urology.        Hypercholesterolemia    Low cholesterol diet and exercise.  Follow lipid panel.        Relevant Orders   Lipid panel   Hepatic function panel   Left ankle pain - Primary    Persistent pain and swelling of left ankle.  Check xray.        Relevant Orders   DG Ankle 2 Views Left   Rib pain on right side    Persistent rib pain.  Will check xray.  Further w/up/treatment - pending results.        Relevant Orders   DG Ribs Unilateral Right       Dale , MD

## 2015-12-03 ENCOUNTER — Encounter: Payer: Self-pay | Admitting: Internal Medicine

## 2015-12-03 DIAGNOSIS — N1330 Unspecified hydronephrosis: Secondary | ICD-10-CM | POA: Insufficient documentation

## 2015-12-03 NOTE — Assessment & Plan Note (Signed)
On insulin.  Not checking sugars.  States were stable and stopped checking.  Taking 20 units of levemir q am and 4 units of humalog q supper.  No low sugars.  Discussed diet and exercise.  Check metabolic panel and a1c.

## 2015-12-03 NOTE — Assessment & Plan Note (Signed)
Persistent rib pain.  Will check xray.  Further w/up/treatment - pending results.

## 2015-12-03 NOTE — Assessment & Plan Note (Signed)
Low cholesterol diet and exercise.  Follow lipid panel.   

## 2015-12-03 NOTE — Assessment & Plan Note (Addendum)
On protonix.  Upper symptoms controlled if take.

## 2015-12-03 NOTE — Assessment & Plan Note (Signed)
Persistent pain and swelling of left ankle.  Check xray.

## 2015-12-03 NOTE — Assessment & Plan Note (Signed)
Noted on CT scan when evaluated in ER for kidney stone.  Check kidney function tests.  Refer to urology.  Never f/u with urology.

## 2015-12-05 ENCOUNTER — Telehealth: Payer: Self-pay | Admitting: *Deleted

## 2015-12-05 MED ORDER — INSULIN LISPRO 100 UNIT/ML (KWIKPEN): PEN_INJECTOR | SUBCUTANEOUS | Status: DC

## 2015-12-05 MED ORDER — INSULIN PEN NEEDLE 32G X 4 MM MISC: Status: DC

## 2015-12-05 NOTE — Telephone Encounter (Signed)
filled

## 2015-12-05 NOTE — Telephone Encounter (Signed)
Medication refill for insulin lispro ans insulin pen needles Pharmacy [facility] [place]

## 2015-12-14 ENCOUNTER — Other Ambulatory Visit: Payer: Commercial Managed Care - HMO

## 2015-12-21 ENCOUNTER — Other Ambulatory Visit (INDEPENDENT_AMBULATORY_CARE_PROVIDER_SITE_OTHER): Payer: Commercial Managed Care - HMO

## 2015-12-21 DIAGNOSIS — E7800 Pure hypercholesterolemia, unspecified: Secondary | ICD-10-CM

## 2015-12-21 DIAGNOSIS — E119 Type 2 diabetes mellitus without complications: Secondary | ICD-10-CM | POA: Diagnosis not present

## 2015-12-21 DIAGNOSIS — Z794 Long term (current) use of insulin: Secondary | ICD-10-CM | POA: Diagnosis not present

## 2015-12-21 LAB — BASIC METABOLIC PANEL
BUN: 16 mg/dL (ref 6–23)
CO2: 26 mEq/L (ref 19–32)
Calcium: 9.4 mg/dL (ref 8.4–10.5)
Chloride: 104 mEq/L (ref 96–112)
Creatinine, Ser: 0.9 mg/dL (ref 0.40–1.50)
GFR: 94.2 mL/min (ref >60.00)
Glucose, Bld: 121 mg/dL — ABNORMAL HIGH (ref 70–99)
Potassium: 3.7 mEq/L (ref 3.5–5.1)
Sodium: 138 mEq/L (ref 135–145)

## 2015-12-21 LAB — HEMOGLOBIN A1C: HEMOGLOBIN A1C: 6.2 % (ref 4.6–6.5)

## 2015-12-21 LAB — HEPATIC FUNCTION PANEL
ALBUMIN: 4.3 g/dL (ref 3.5–5.2)
ALK PHOS: 74 U/L (ref 39–117)
ALT: 16 U/L (ref 0–53)
AST: 15 U/L (ref 0–37)
Bilirubin, Direct: 0.1 mg/dL (ref 0.0–0.3)
TOTAL PROTEIN: 7.5 g/dL (ref 6.0–8.3)
Total Bilirubin: 0.5 mg/dL (ref 0.2–1.2)

## 2015-12-21 LAB — TSH: TSH: 3.26 u[IU]/mL (ref 0.35–4.50)

## 2015-12-21 LAB — LIPID PANEL
Cholesterol: 180 mg/dL (ref 0–200)
HDL: 40.8 mg/dL (ref >39.00)
LDL Cholesterol: 115 mg/dL — ABNORMAL HIGH (ref 0–99)
NonHDL: 139.53
TRIGLYCERIDES: 123 mg/dL (ref 0.0–149.0)
Total CHOL/HDL Ratio: 4
VLDL: 24.6 mg/dL (ref 0.0–40.0)

## 2015-12-21 LAB — MICROALBUMIN / CREATININE URINE RATIO
CREATININE, U: 197.1 mg/dL
MICROALB/CREAT RATIO: 0.7 mg/g (ref 0.0–30.0)
Microalb, Ur: 1.3 mg/dL (ref 0.0–1.9)

## 2015-12-22 ENCOUNTER — Encounter: Payer: Self-pay | Admitting: Internal Medicine

## 2015-12-27 NOTE — Telephone Encounter (Signed)
Unread mychart message mailed to patient 

## 2016-01-10 ENCOUNTER — Other Ambulatory Visit: Payer: Self-pay | Admitting: Internal Medicine

## 2016-01-12 ENCOUNTER — Other Ambulatory Visit: Payer: Self-pay | Admitting: Surgical

## 2016-01-12 NOTE — Telephone Encounter (Signed)
Pt wife called to check the status of pt medication. Pt has been out of the medication for 2 days now. Pharmacy is [facility] [place], [place] - [address] Call wife [phone] or work number [phone]. Thank you!

## 2016-02-20 ENCOUNTER — Ambulatory Visit: Payer: Commercial Managed Care - HMO | Admitting: Internal Medicine

## 2016-02-20 DIAGNOSIS — Z0289 Encounter for other administrative examinations: Secondary | ICD-10-CM

## 2016-03-29 ENCOUNTER — Other Ambulatory Visit: Payer: Self-pay | Admitting: Internal Medicine

## 2016-05-30 ENCOUNTER — Other Ambulatory Visit: Payer: Self-pay | Admitting: Internal Medicine

## 2016-06-03 ENCOUNTER — Other Ambulatory Visit: Payer: Self-pay

## 2016-06-03 MED ORDER — INSULIN PEN NEEDLE 32G X 4 MM MISC: 2 refills | Status: DC

## 2016-10-18 ENCOUNTER — Other Ambulatory Visit: Payer: Self-pay | Admitting: Internal Medicine

## 2017-01-25 IMAGING — CT CT RENAL STONE PROTOCOL
1 of 2 series · 15 of 32 positions shown, 19 images · non-contrast
Comparison: None.

CLINICAL DATA: Lower abdominal pain radiating to the right.

EXAM:
CT ABDOMEN AND PELVIS WITHOUT CONTRAST
TECHNIQUE: Multidetector CT imaging of the abdomen and pelvis was performed
following the standard protocol without IV contrast.

[Series 2: stone standard full · axial · 0.76mm/px · z∈[-560,-120]mm · 15 of 96 slices shown, 19 images]
[im 4/96  soft-tissue]
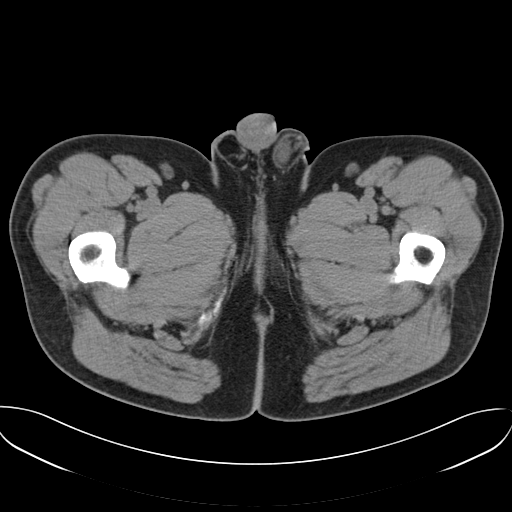
[im 4/96  bone]
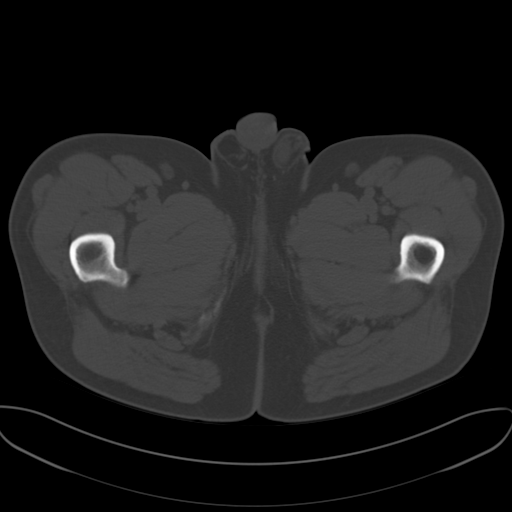
[im 12/96  soft-tissue]
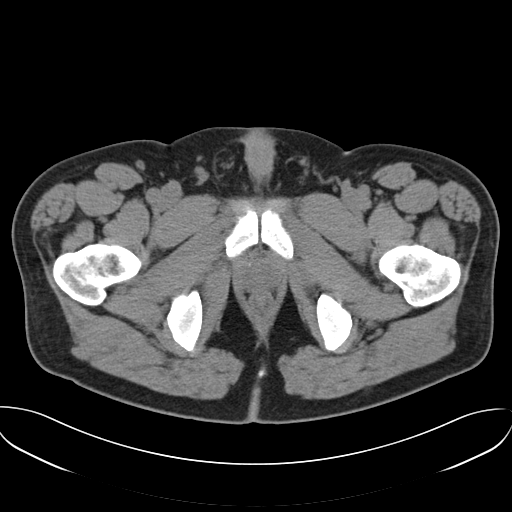
[im 20/96  soft-tissue]
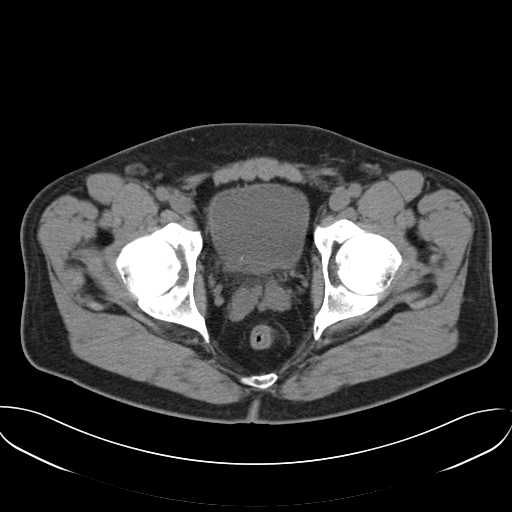
[im 28/96  soft-tissue]
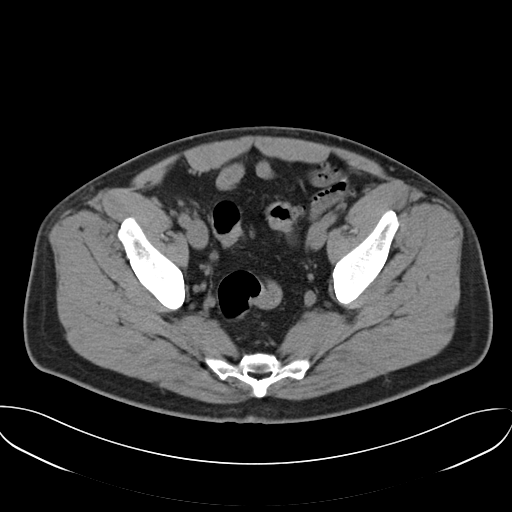
[im 32/96  soft-tissue]
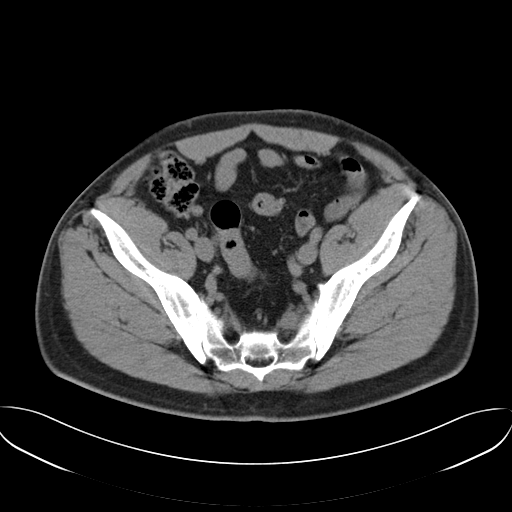
[im 40/96  soft-tissue]
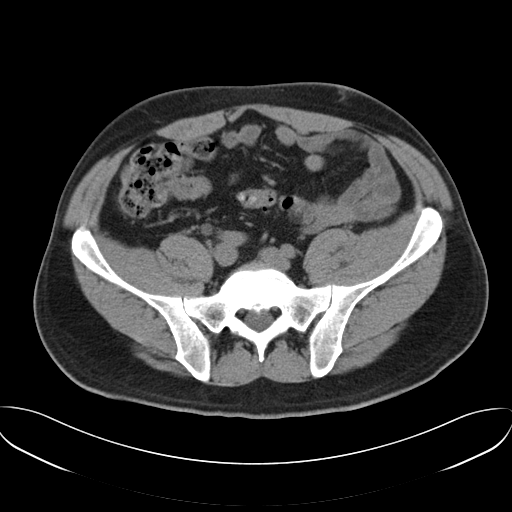
[im 48/96  soft-tissue]
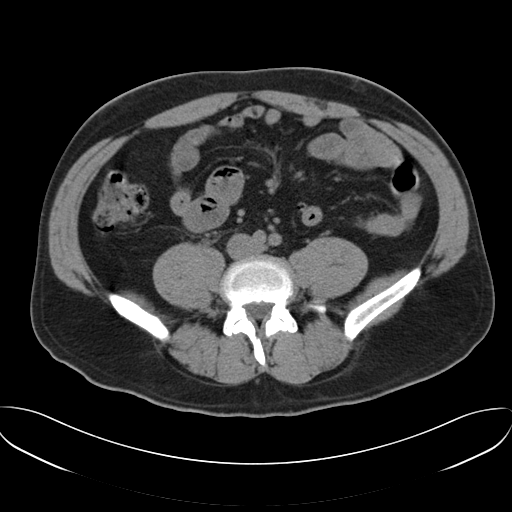
[im 56/96  soft-tissue]
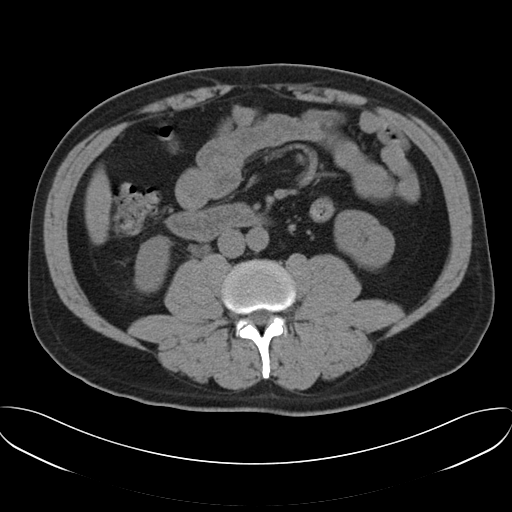
[im 64/96  soft-tissue]
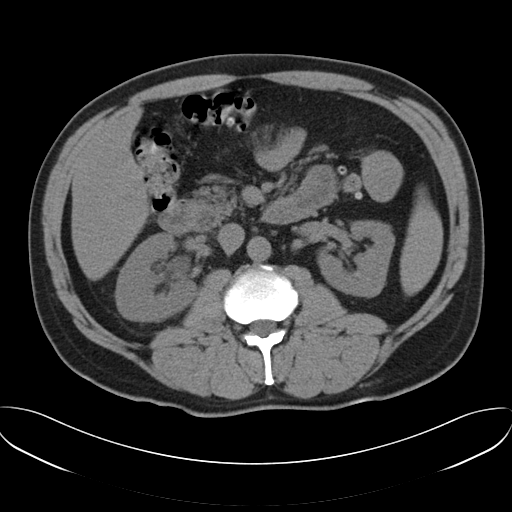
[im 64/96  bone]
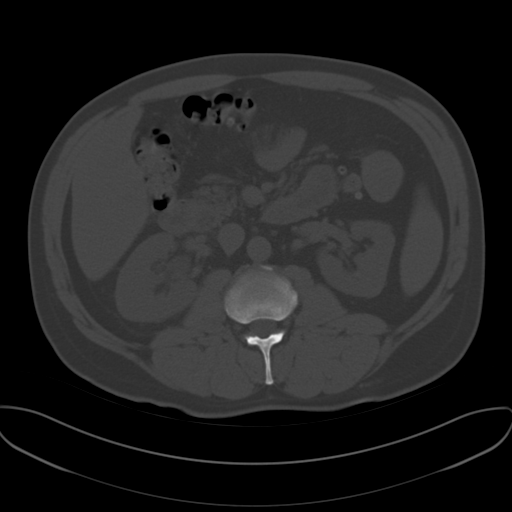
[im 68/96  soft-tissue]
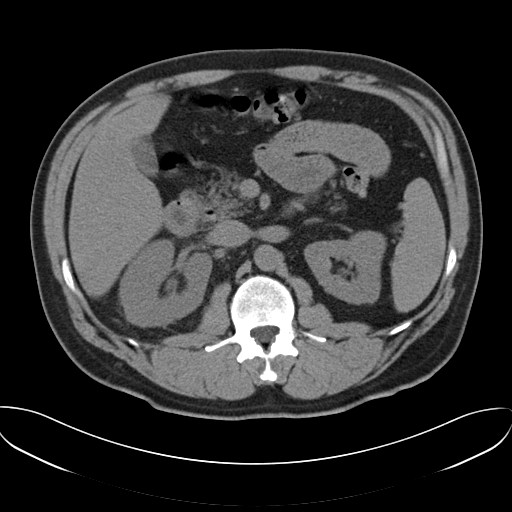
[im 76/96  soft-tissue]
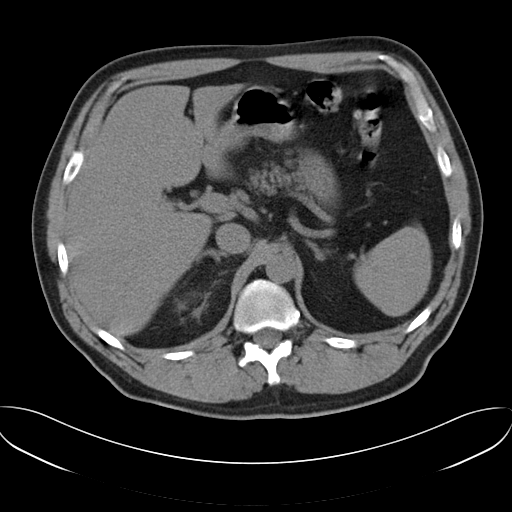
[im 80/96  lung]
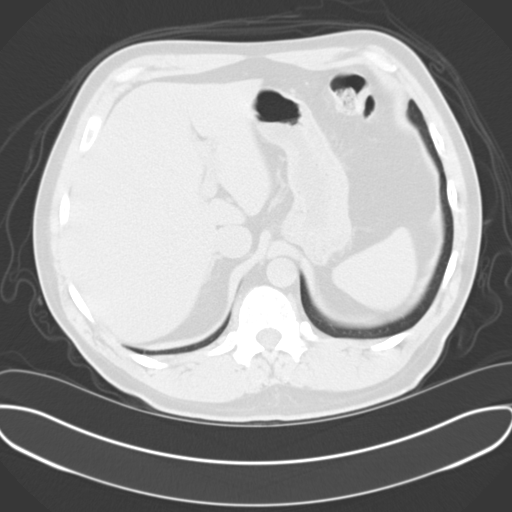
[im 84/96  soft-tissue]
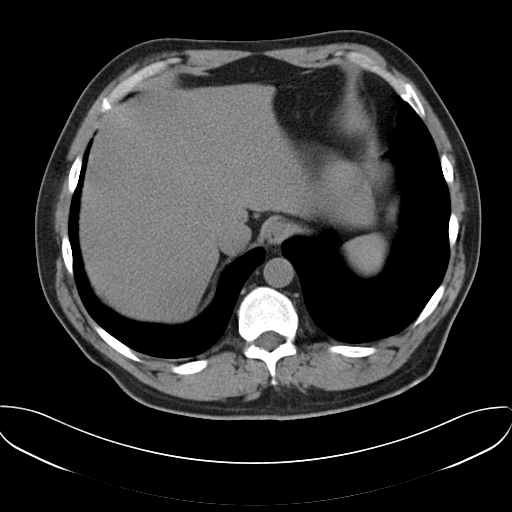
[im 84/96  lung]
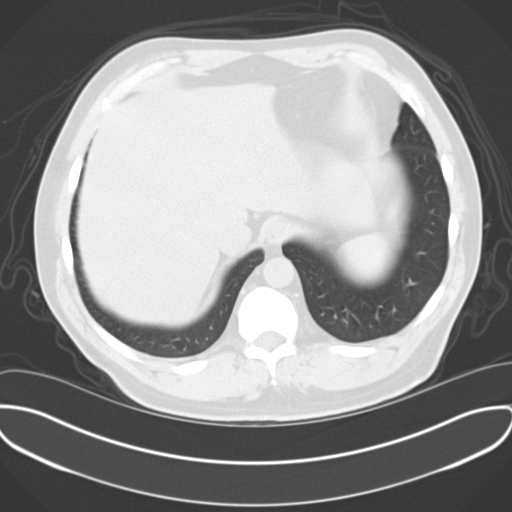
[im 88/96  lung]
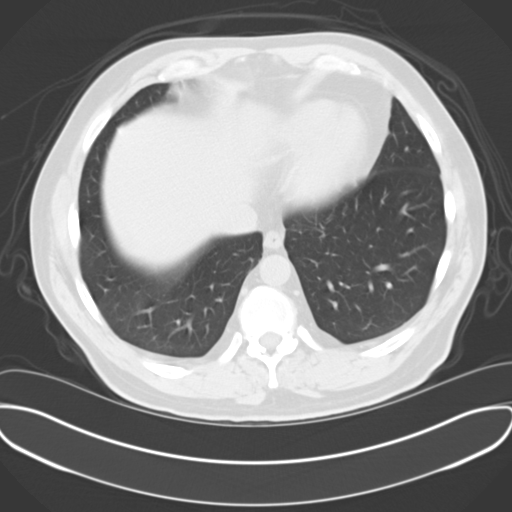
[im 92/96  soft-tissue]
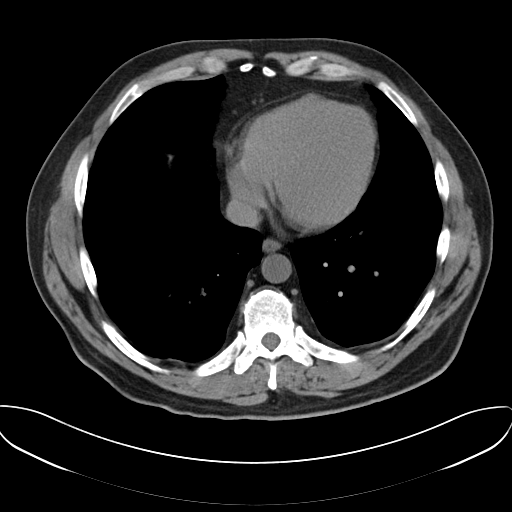
[im 92/96  lung]
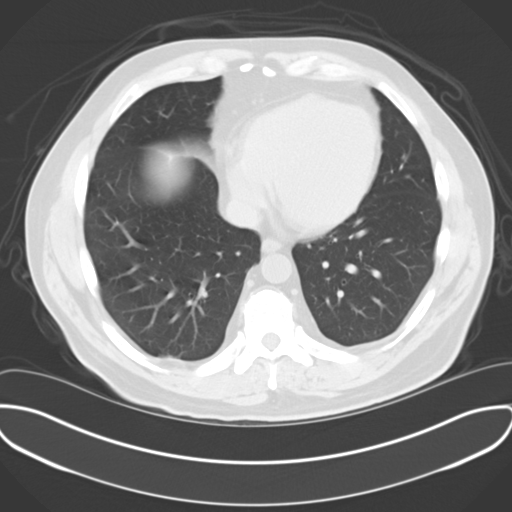

[15 of 32 positions shown; findings below may reference images not displayed]

FINDINGS: Lower chest and abdominal wall: Fatty expansion of the bilateral
inguinal canal.

Incidental subcutaneous shrapnel in the left lower chest wall.

Hepatobiliary: No focal liver abnormality.No evidence of biliary
obstruction or stone.

Pancreas: Unremarkable.

Spleen: Unremarkable.

Adrenals/Urinary Tract:  Negative adrenals.

Mild to moderate right hydroureteronephrosis with perinephric edema
secondary to a recently passed ureteral stone measuring 3 mm,
located in the right bladder, but likely still within the UVJ given
the hydronephrosis. No additional urolithiasis.

Reproductive:No pathologic findings.

Stomach/Bowel: Diffuse colonic diverticulosis. Mid duodenal
diverticulum. No bowel inflammation or obstruction.

Vascular/Lymphatic: No acute vascular abnormality. No mass or
adenopathy.

Peritoneal: No ascites or pneumoperitoneum.

Musculoskeletal: No acute abnormalities.
IMPRESSION: 1. Right hydroureteronephrosis from 3 mm UVJ calculus.
2. Extensive colonic diverticulosis.

## 2017-02-03 ENCOUNTER — Other Ambulatory Visit: Payer: Self-pay | Admitting: Internal Medicine

## 2017-02-03 ENCOUNTER — Telehealth: Payer: Self-pay | Admitting: *Deleted

## 2017-02-03 NOTE — Telephone Encounter (Signed)
Can we refill? 

## 2017-02-03 NOTE — Telephone Encounter (Signed)
Patient no showed for last appt, last OV was 11/28/2015, please advise, no upcoming appts. thanks

## 2017-02-03 NOTE — Telephone Encounter (Signed)
Medication Refill requested for :levemir  And pen needles  Pharmacy:[facility] in [place]  Return Contact :[phone]

## 2017-02-04 NOTE — Telephone Encounter (Signed)
Left message to return call to our office.  

## 2017-02-04 NOTE — Telephone Encounter (Signed)
[facility]  Needs a f/u appt scheduled.  This needs to be scheduled and pt notified before refill until appt.

## 2017-02-04 NOTE — Telephone Encounter (Signed)
Have not seen him in over one year.  Unable to continue to refill medication without visit.  Needs to schedule appt and will only refill until appt.  Make sure appt scheduled and pt aware, before refill sent in.

## 2017-02-06 NOTE — Telephone Encounter (Signed)
Called both numbers l/m to call office  

## 2017-02-12 NOTE — Telephone Encounter (Signed)
Lm to call office

## 2017-02-19 NOTE — Telephone Encounter (Signed)
No call back letter mailed to call office.

## 2017-04-21 ENCOUNTER — Other Ambulatory Visit: Payer: Self-pay

## 2017-04-21 ENCOUNTER — Telehealth: Payer: Self-pay | Admitting: Internal Medicine

## 2017-04-21 MED ORDER — INSULIN LISPRO 100 UNIT/ML (KWIKPEN): PEN_INJECTOR | SUBCUTANEOUS | 3 refills | Status: DC

## 2017-04-21 MED ORDER — INSULIN LISPRO 100 UNIT/ML (KWIKPEN): PEN_INJECTOR | SUBCUTANEOUS | 3 refills | Status: AC

## 2017-04-21 MED ORDER — INSULIN DETEMIR 100 UNIT/ML FLEXPEN: PEN_INJECTOR | SUBCUTANEOUS | 5 refills | Status: AC

## 2017-04-21 NOTE — Telephone Encounter (Signed)
Tried to send in but keeps printing? Not sure why I have put in your blue folder to sign if ok to fill.

## 2017-04-21 NOTE — Telephone Encounter (Signed)
Sent levemir to pharmacy

## 2017-04-21 NOTE — Telephone Encounter (Signed)
Pt spouse called back and stated that pt needed LEVEMIR FLEXTOUCH 100 UNIT/ML Pen. Pt is completely out. Please advise, thank you!  Pharmacy - [phone] [place] [address] [facility] [address]  Call spouse @ [phone]

## 2017-04-21 NOTE — Telephone Encounter (Signed)
Left message to return call to our office.  

## 2017-04-21 NOTE — Telephone Encounter (Signed)
Pt is out of insulin lispro (HUMALOG KWIKPEN) 100 UNIT/ML KiwkPen, could you please refill. Pt doesn't have an appt until October. Please advise.

## 2017-04-21 NOTE — Telephone Encounter (Signed)
He has not had labs in over one year.  Needs to schedule labs and then can refill until appt only.  Need to confirm with him how he is taking his insulin now.

## 2017-05-09 ENCOUNTER — Other Ambulatory Visit: Payer: Self-pay | Admitting: Internal Medicine

## 2017-05-09 NOTE — Telephone Encounter (Signed)
Patient has not been see since 3/17 ok to fill ?

## 2017-05-09 NOTE — Telephone Encounter (Signed)
Please schedule f/u appt then ok to refill until appt.  Needs to keep appt.

## 2017-05-09 NOTE — Telephone Encounter (Signed)
Lefty message fo r patient to call office.

## 2017-07-08 ENCOUNTER — Ambulatory Visit: Payer: Commercial Managed Care - HMO | Admitting: Internal Medicine

## 2017-07-08 ENCOUNTER — Encounter: Payer: Self-pay | Admitting: Internal Medicine

## 2017-07-08 DIAGNOSIS — Z0289 Encounter for other administrative examinations: Secondary | ICD-10-CM

## 2017-09-04 ENCOUNTER — Other Ambulatory Visit: Payer: Self-pay | Admitting: Internal Medicine

## 2017-10-06 ENCOUNTER — Telehealth: Payer: Self-pay

## 2017-10-06 NOTE — Telephone Encounter (Signed)
Copied from CRM [id number]. Topic: Inquiry >> Oct 06, 2017  3:18 PM Windy KalataMichael, Taylor L, NT wrote: Reason for CRM: patient is needing an appt with Dr. Lorin PicketScott about medications. Patient also wants to be referred to a orthopedic for neck pain. Patient wants to be seen by his PCP Dr. Lorin PicketScott if at all possible. She is booked until April 2nd Please advise and contact patient.

## 2017-10-08 NOTE — Telephone Encounter (Signed)
Will hold for cancellation.

## 2017-10-08 NOTE — Telephone Encounter (Signed)
Please hold for cancellation per Dr. Lorin PicketScott and let patient know that he must keep appt once scheduled

## 2017-10-10 NOTE — Telephone Encounter (Signed)
Lm on vm to call back and schedule appt  °

## 2017-10-20 DIAGNOSIS — M9902 Segmental and somatic dysfunction of thoracic region: Secondary | ICD-10-CM | POA: Diagnosis not present

## 2017-10-20 DIAGNOSIS — M9901 Segmental and somatic dysfunction of cervical region: Secondary | ICD-10-CM | POA: Diagnosis not present

## 2017-10-20 DIAGNOSIS — R51 Headache: Secondary | ICD-10-CM | POA: Diagnosis not present

## 2017-10-21 DIAGNOSIS — R51 Headache: Secondary | ICD-10-CM | POA: Diagnosis not present

## 2017-10-21 DIAGNOSIS — M9901 Segmental and somatic dysfunction of cervical region: Secondary | ICD-10-CM | POA: Diagnosis not present

## 2017-10-21 DIAGNOSIS — M9902 Segmental and somatic dysfunction of thoracic region: Secondary | ICD-10-CM | POA: Diagnosis not present

## 2017-10-22 DIAGNOSIS — M9901 Segmental and somatic dysfunction of cervical region: Secondary | ICD-10-CM | POA: Diagnosis not present

## 2017-10-22 DIAGNOSIS — R51 Headache: Secondary | ICD-10-CM | POA: Diagnosis not present

## 2017-10-22 DIAGNOSIS — M9902 Segmental and somatic dysfunction of thoracic region: Secondary | ICD-10-CM | POA: Diagnosis not present

## 2017-10-29 DIAGNOSIS — M9901 Segmental and somatic dysfunction of cervical region: Secondary | ICD-10-CM | POA: Diagnosis not present

## 2017-10-29 DIAGNOSIS — M9902 Segmental and somatic dysfunction of thoracic region: Secondary | ICD-10-CM | POA: Diagnosis not present

## 2017-10-29 DIAGNOSIS — R51 Headache: Secondary | ICD-10-CM | POA: Diagnosis not present

## 2017-11-20 ENCOUNTER — Other Ambulatory Visit: Payer: Self-pay | Admitting: Internal Medicine

## 2017-12-01 ENCOUNTER — Other Ambulatory Visit: Payer: Self-pay | Admitting: Internal Medicine

## 2017-12-28 ENCOUNTER — Other Ambulatory Visit: Payer: Self-pay | Admitting: Internal Medicine

## 2018-02-13 ENCOUNTER — Other Ambulatory Visit: Payer: Self-pay | Admitting: Internal Medicine

## 2018-02-13 NOTE — Telephone Encounter (Signed)
Patient was last seen in 11/2015 and does not have an appointment scheduled. Please advise for refill

## 2018-03-16 ENCOUNTER — Other Ambulatory Visit: Payer: Self-pay | Admitting: Internal Medicine

## 2018-03-31 ENCOUNTER — Other Ambulatory Visit: Payer: Self-pay | Admitting: Internal Medicine

## 2018-05-11 DIAGNOSIS — K219 Gastro-esophageal reflux disease without esophagitis: Secondary | ICD-10-CM | POA: Diagnosis not present

## 2018-05-11 DIAGNOSIS — Z794 Long term (current) use of insulin: Secondary | ICD-10-CM | POA: Diagnosis not present

## 2018-05-11 DIAGNOSIS — E119 Type 2 diabetes mellitus without complications: Secondary | ICD-10-CM | POA: Diagnosis not present

## 2018-05-11 DIAGNOSIS — E7800 Pure hypercholesterolemia, unspecified: Secondary | ICD-10-CM | POA: Diagnosis not present

## 2022-01-15 ENCOUNTER — Other Ambulatory Visit: Payer: Self-pay

## 2022-01-15 ENCOUNTER — Encounter: Payer: Self-pay | Admitting: Emergency Medicine

## 2022-01-15 ENCOUNTER — Emergency Department
Admission: EM | Admit: 2022-01-15 | Discharge: 2022-01-15 | Disposition: A | Discharge status: Home / Self Care | Payer: Self-pay | Attending: Student in an Organized Health Care Education/Training Program | Admitting: Student in an Organized Health Care Education/Training Program

## 2022-01-15 ENCOUNTER — Emergency Department: Payer: Self-pay

## 2022-01-15 DIAGNOSIS — L03113 Cellulitis of right upper limb: Secondary | ICD-10-CM | POA: Insufficient documentation

## 2022-01-15 DIAGNOSIS — R739 Hyperglycemia, unspecified: Secondary | ICD-10-CM

## 2022-01-15 DIAGNOSIS — L02511 Cutaneous abscess of right hand: Secondary | ICD-10-CM | POA: Insufficient documentation

## 2022-01-15 DIAGNOSIS — L02519 Cutaneous abscess of unspecified hand: Secondary | ICD-10-CM

## 2022-01-15 DIAGNOSIS — E1165 Type 2 diabetes mellitus with hyperglycemia: Secondary | ICD-10-CM | POA: Insufficient documentation

## 2022-01-15 LAB — CBC
HCT: 38.9 % — ABNORMAL LOW (ref 39.0–52.0)
Hemoglobin: 13.4 g/dL (ref 13.0–17.0)
MCH: 32.3 pg (ref 26.0–34.0)
MCHC: 34.4 g/dL (ref 30.0–36.0)
MCV: 93.7 fL (ref 80.0–100.0)
Platelets: 233 10*3/uL (ref 150–400)
RBC: 4.15 MIL/uL — ABNORMAL LOW (ref 4.22–5.81)
RDW: 11.8 % (ref 11.5–15.5)
WBC: 8.1 10*3/uL (ref 4.0–10.5)
nRBC: 0 % (ref 0.0–0.2)

## 2022-01-15 LAB — BASIC METABOLIC PANEL
Anion gap: 10 (ref 5–15)
BUN: 11 mg/dL (ref 6–20)
CO2: 19 mmol/L — ABNORMAL LOW (ref 22–32)
Calcium: 9 mg/dL (ref 8.9–10.3)
Chloride: 102 mmol/L (ref 98–111)
Creatinine, Ser: 0.74 mg/dL (ref 0.61–1.24)
GFR, Estimated: >60 mL/min (ref >60)
Glucose, Bld: 341 mg/dL — ABNORMAL HIGH (ref 70–99)
Potassium: 3.4 mmol/L — ABNORMAL LOW (ref 3.5–5.1)
Sodium: 131 mmol/L — ABNORMAL LOW (ref 135–145)

## 2022-01-15 LAB — CBG MONITORING, ED: Glucose-Capillary: 216 mg/dL — ABNORMAL HIGH (ref 70–99)

## 2022-01-15 MED ORDER — METFORMIN HCL 500 MG PO TABS
1000.0000 mg | ORAL_TABLET | Freq: Once | ORAL | Status: AC
  Administered 2022-01-15: 1000 mg via ORAL
  Filled 2022-01-15: qty 2

## 2022-01-15 MED ORDER — LIDOCAINE HCL (PF) 1 % IJ SOLN
INTRAMUSCULAR | Status: AC
  Administered 2022-01-15: 5 mL
  Filled 2022-01-15: qty 5

## 2022-01-15 MED ORDER — LIDOCAINE HCL (PF) 1 % IJ SOLN: 5.0000 mL | Freq: Once | INTRAMUSCULAR | Status: AC

## 2022-01-15 MED ORDER — METFORMIN HCL 1000 MG PO TABS
1000.0000 mg | ORAL_TABLET | Freq: Two times a day (BID) | ORAL | 0 refills | Status: AC
Start: 2022-01-15 — End: ?

## 2022-01-15 MED ORDER — INSULIN ASPART 100 UNIT/ML IJ SOLN
8.0000 [IU] | Freq: Once | INTRAMUSCULAR | Status: AC
  Administered 2022-01-15: 8 [IU] via SUBCUTANEOUS
  Filled 2022-01-15: qty 1

## 2022-01-15 MED ORDER — SODIUM CHLORIDE 0.9 % IV SOLN
1.0000 g | Freq: Once | INTRAVENOUS | Status: AC
  Administered 2022-01-15: 1 g via INTRAVENOUS
  Filled 2022-01-15: qty 10

## 2022-01-15 MED ORDER — VANCOMYCIN HCL IN DEXTROSE 1-5 GM/200ML-% IV SOLN
1000.0000 mg | Freq: Once | INTRAVENOUS | Status: AC
  Administered 2022-01-15: 1000 mg via INTRAVENOUS
  Filled 2022-01-15: qty 200

## 2022-01-15 MED ORDER — SULFAMETHOXAZOLE-TRIMETHOPRIM 800-160 MG PO TABS: 1.0000 | ORAL_TABLET | Freq: Two times a day (BID) | ORAL | 0 refills | Status: AC

## 2022-01-15 MED ORDER — BACITRACIN ZINC 500 UNIT/GM EX OINT
TOPICAL_OINTMENT | Freq: Once | CUTANEOUS | Status: AC
  Filled 2022-01-15: qty 0.9

## 2022-01-15 MED ORDER — LIDOCAINE-EPINEPHRINE-TETRACAINE (LET) TOPICAL GEL
3.0000 mL | Freq: Once | TOPICAL | Status: AC
  Administered 2022-01-15: 3 mL via TOPICAL
  Filled 2022-01-15: qty 3

## 2022-01-15 MED ORDER — SODIUM CHLORIDE 0.9 % IV BOLUS
1000.0000 mL | Freq: Once | INTRAVENOUS | Status: AC
  Administered 2022-01-15: 1000 mL via INTRAVENOUS

## 2022-01-15 NOTE — ED Provider Notes (Signed)
? ?[facility] ?Provider Note ? ? ? Event Date/Time  ? First MD Initiated Contact with Patient 01/15/22 1052   ?  (approximate) ? ? ?History  ? ?Wrist Pain ? ? ?HPI ? ?Victor Wagner is a 56 y.o. male presents to the ER for evaluation of right wrist pain and swelling is grossly worse over the past few days patient had a large board fall on his right hand and caused an abrasion to the wrist.  This has been trying to topically managed with IcyHot for the past several days.  Started noticing over the past 48 hours significant swelling and pain.  Had a large blister as well with clear drainage.  Denies any obvious foreign body at the time.  States he does have a history of diabetes not currently on any medication. ?  ? ? ?Physical Exam  ? ?Triage Vital Signs: ?ED Triage Vitals [01/15/22 1035]  ?Enc Vitals Group  ?   BP (!) 166/110  ?   Pulse Rate 92  ?   Resp 18  ?   Temp 98.7 ?F (37.1 ?C)  ?   Temp Source Oral  ?   SpO2 98 %  ?   Weight 175 lb (79.4 kg)  ?   Height 5\' 10"  (1.778 m)  ?   Head Circumference   ?   Peak Flow   ?   Pain Score 8  ?   Pain Loc   ?   Pain Edu?   ?   Excl. in [place]?   ? ? ?Most recent vital signs: ?Vitals:  ? 01/15/22 1035 01/15/22 1344  ?BP: (!) 166/110 (!) 160/100  ?Pulse: 92 88  ?Resp: 18 18  ?Temp: 98.7 ?F (37.1 ?C)   ?SpO2: 98% 98%  ? ? ? ?Constitutional: Alert  ?Eyes: Conjunctivae are normal.  ?Head: Atraumatic. ?Nose: No congestion/rhinnorhea. ?Mouth/Throat: Mucous membranes are moist.   ?Neck: Painless ROM.  ?Cardiovascular:   Good peripheral circulation. ?Respiratory: Normal respiratory effort.  No retractions.  ?Gastrointestinal: Soft and nontender.  ?Musculoskeletal: Right forearm with fair amount of pitting edema and streaking cellulitis.  Large golf ball size abscess on the volar forearm just proximal to the wrist with overlying sloughing of the superficial dermal layer no purulent drainage is fluctuant.  Able to flex and extend fingers and wrist some limitation  due to the edema and swelling.  Neurovascular intact distally. ?Neurologic:  MAE spontaneously. No gross focal neurologic deficits are appreciated.  ?Skin:  Skin is warm, dry and intact. No rash noted. ?Psychiatric: Mood and affect are normal. Speech and behavior are normal. ? ? ? ?ED Results / Procedures / Treatments  ? ?Labs ?(all labs ordered are listed, but only abnormal results are displayed) ?Labs Reviewed  ?CBC - Abnormal; Notable for the following components:  ?    Result Value  ? RBC 4.15 (*)   ? HCT 38.9 (*)   ? All other components within normal limits  ?BASIC METABOLIC PANEL - Abnormal; Notable for the following components:  ? Sodium 131 (*)   ? Potassium 3.4 (*)   ? CO2 19 (*)   ? Glucose, Bld 341 (*)   ? All other components within normal limits  ?CBG MONITORING, ED - Abnormal; Notable for the following components:  ? Glucose-Capillary 216 (*)   ? All other components within normal limits  ?AEROBIC/ANAEROBIC CULTURE W GRAM STAIN (SURGICAL/DEEP WOUND)  ? ? ? ?EKG ? ? ? ? ?RADIOLOGY ?Please see ED  Course for my review and interpretation. ? ?I personally reviewed all radiographic images ordered to evaluate for the above acute complaints and reviewed radiology reports and findings.  These findings were personally discussed with the patient.  Please see medical record for radiology report. ? ? ? ?PROCEDURES: ? ?Critical Care performed: No ? ?Marland Kitchen.Incision and Drainage ? ?Date/Time: 01/15/2022 1:50 PM ?Performed by: Merlyn Lot, MD ?Authorized by: Merlyn Lot, MD  ? ?Consent:  ?  Consent obtained:  Verbal ?  Consent given by:  Patient ?  Risks discussed:  Bleeding, infection, incomplete drainage and pain ?  Alternatives discussed:  Alternative treatment, delayed treatment and observation ?Location:  ?  Type:  Abscess ?  Location:  Upper extremity ?  Upper extremity location:  Wrist ?  Wrist location:  R wrist ?Pre-procedure details:  ?  Skin preparation:  Povidone-iodine ?Anesthesia:  ?  Anesthesia  method:  Local infiltration ?  Local anesthetic:  Lidocaine 1% w/o epi ?Procedure type:  ?  Complexity:  Complex ?Procedure details:  ?  Incision types:  Stab incision ?  Incision depth:  Subcutaneous ?  Wound management:  Extensive cleaning ?  Drainage:  Purulent ?  Drainage amount:  Moderate ?  Wound treatment:  Wound left open ?  Packing materials:  None ?Post-procedure details:  ?  Procedure completion:  Tolerated well, no immediate complications ? ? ?MEDICATIONS ORDERED IN ED: ?Medications  ?sodium chloride 0.9 % bolus 1,000 mL (1,000 mLs Intravenous New Bag/Given 01/15/22 1222)  ?insulin aspart (novoLOG) injection 8 Units (8 Units Subcutaneous Given 01/15/22 1221)  ?lidocaine-EPINEPHrine-tetracaine (LET) topical gel (3 mLs Topical Given 01/15/22 1255)  ?cefTRIAXone (ROCEPHIN) 1 g in sodium chloride 0.9 % 100 mL IVPB (0 g Intravenous Stopped 01/15/22 1330)  ?vancomycin (VANCOCIN) IVPB 1000 mg/200 mL premix (0 mg Intravenous Stopped 01/15/22 1433)  ?lidocaine (PF) (XYLOCAINE) 1 % injection 5 mL (5 mLs Infiltration Given by Other 01/15/22 1343)  ?metFORMIN (GLUCOPHAGE) tablet 1,000 mg (1,000 mg Oral Given 01/15/22 1359)  ?bacitracin ointment ( Topical Given by Other 01/15/22 1433)  ? ? ? ?IMPRESSION / MDM / ASSESSMENT AND PLAN / ED COURSE  ?I reviewed the triage vital signs and the nursing notes. ?             ?               ? ?Differential diagnosis includes, but is not limited to, abscess, cellulitis, foreign body, fracture, cyst, NSTI ? ?Patient presented to ER for evaluation of wound and swelling to his right wrist.  He is right-hand dominant.  Injury sustained as described above but failing to heal.  Blood work as well as imaging ordered for the above differential.  ? ?Clinical Course as of 01/15/22 1451  ?Tue Jan 15, 2022  ?1127 My review and interpretation of patient's wrist film shows no foreign body no fracture.  My review of bedside ultrasound there is area of fluctuance but unusual appearance for abscess will  order formal ultrasound. [PR]  ?1322 I&D performed with large amount of purulent drainage.  Patient tolerated well. [PR]  ?1347 Based on the extent cellulitis surrounding the abscess and patient's history of diabetes, I have recommended admission the hospital for IV fluids tight glycemic control and IV antibiotics.  Patient reluctant to be admitted to the hospital and would prefer to try outpatient management.  He is not septic at this time. as the abscess has been drained, trial of outpatient management is within reason though not my  initial recommendation.   [PR]  ?1450 Glucose is improving.  Again reiterated my recommendation the patient be admitted for IV antibiotics and tight glycemic control.  Patient electing to proceed with outpatient management.  Patient tolerating p.o. remains hemodynamically stable.  Discussed conservative and supportive care patient will be sent prescription for Bactrim as well as metformin which previously controlled his blood sugar.  Patient to return in 24 to 48 hours for repeat wound check. [PR]  ?  ?Clinical Course User Index ?[PR] Merlyn Lot, MD  ? ? ? ?FINAL CLINICAL IMPRESSION(S) / ED DIAGNOSES  ? ?Final diagnoses:  ?Cellulitis and abscess of hand  ?Hyperglycemia  ? ? ? ?Rx / DC Orders  ? ?ED Discharge Orders   ? ?      Ordered  ?  sulfamethoxazole-trimethoprim (BACTRIM DS) 800-160 MG tablet  2 times daily       ? 01/15/22 1450  ?  metFORMIN (GLUCOPHAGE) 1000 MG tablet  2 times daily with meals       ? 01/15/22 1450  ? ?  ?  ? ?  ? ? ? ?Note:  This document was prepared using Dragon voice recognition software and may include unintentional dictation errors. ? ?  ?Merlyn Lot, MD ?01/15/22 1451 ? ?

## 2022-01-15 NOTE — ED Notes (Signed)
See triage note  presents with injury to right wrist  states he had a board fall onto wrist last Tuesday  states has an abrasion to wrist    states now hand and f/a is swollen   redness noted up forearm ?

## 2022-01-15 NOTE — ED Triage Notes (Signed)
Pt here with right wrist pain after dropping a board on his wrist. Pt tried to using Icy Hot to relive swelling then his wrist became infected.Wrist is red and hot to touch. Pt has hx of DM. ?

## 2022-01-16 ENCOUNTER — Other Ambulatory Visit: Payer: Self-pay

## 2022-01-16 ENCOUNTER — Emergency Department
Admission: EM | Admit: 2022-01-16 | Discharge: 2022-01-16 | Disposition: A | Discharge status: Home / Self Care | Payer: Self-pay | Attending: Emergency Medicine | Admitting: Emergency Medicine

## 2022-01-16 DIAGNOSIS — Z4801 Encounter for change or removal of surgical wound dressing: Secondary | ICD-10-CM | POA: Insufficient documentation

## 2022-01-16 DIAGNOSIS — Z5189 Encounter for other specified aftercare: Secondary | ICD-10-CM

## 2022-01-16 DIAGNOSIS — L02413 Cutaneous abscess of right upper limb: Secondary | ICD-10-CM | POA: Insufficient documentation

## 2022-01-16 NOTE — Discharge Instructions (Signed)
Keep the wounds clean, dry, and covered. Take the antibiotic as directed. Follow-up with a local provider or this ED for interim wound check.  ?

## 2022-01-16 NOTE — ED Triage Notes (Signed)
Pt comes for f/u for right wrist pain and wound. ?

## 2022-01-16 NOTE — ED Provider Notes (Signed)
? ? ?[facility] ?Emergency Department Provider Note ? ? ? ? Event Date/Time  ? First MD Initiated Contact with Patient 01/16/22 1530   ?  (approximate) ? ? ?History  ? ?Hand Injury ? ? ?HPI ? ?Victor Wagner is a 56 y.o. male with history of poorly controlled diabetes and recent trauma to the right wrist, presents for wound check.  Patient was evaluated in the ED 1 day prior, where he had a local I&D procedure performed to a volar right forearm abscess.  Patient presents today for interim wound check noting decrease in pain, decrease in swelling, decrease in surrounding erythema.  He continues to note copious purulent drainage from the open wound.  He presents with a Ace bandage in place to the wrist over the nonstick gauze dressing.  Patient denies any fever, chills, sweats, chest pain.  He reports tolerating the antibiotic as prescribed. ? ? ?Physical Exam  ? ?Triage Vital Signs: ?ED Triage Vitals  ?Enc Vitals Group  ?   BP 01/16/22 1506 (!) 150/100  ?   Pulse Rate 01/16/22 1506 (!) 105  ?   Resp 01/16/22 1506 18  ?   Temp 01/16/22 1506 98 ?F (36.7 ?C)  ?   Temp src --   ?   SpO2 01/16/22 1506 97 %  ?   Weight --   ?   Height --   ?   Head Circumference --   ?   Peak Flow --   ?   Pain Score 01/16/22 1507 2  ?   Pain Loc --   ?   Pain Edu? --   ?   Excl. in [place]? --   ? ? ?Most recent vital signs: ?Vitals:  ? 01/16/22 1506  ?BP: (!) 150/100  ?Pulse: (!) 105  ?Resp: 18  ?Temp: 98 ?F (36.7 ?C)  ?SpO2: 97%  ? ? ?General Awake, no distress.  ?CV:  Good peripheral perfusion.  ?RESP:  Normal effort.  ?ABD:  No distention.  ?SKIN:  Right volar forearm with a large soft tissue abscess noted.  Some surrounding erythema is appreciated but no lymphangitis is noted.  Some edema to the hand distal to the recent Ace bandage placement is appreciated.  Normal composite fist.  Patient with purulent drainage noted with manipulation of the wound.  The overlying abdominal skin is macerated and sloughing. ? ? ?ED  Results / Procedures / Treatments  ? ?Labs ?(all labs ordered are listed, but only abnormal results are displayed) ?Labs Reviewed - No data to display ? ? ?EKG ? ? ?RADIOLOGY ? ? ?No results found. ? ? ?PROCEDURES: ? ?Critical Care performed: No ? ?Procedures ? ? ?MEDICATIONS ORDERED IN ED: ?Medications - No data to display ? ? ?IMPRESSION / MDM / ASSESSMENT AND PLAN / ED COURSE  ?I reviewed the triage vital signs and the nursing notes. ?             ?               ? ?Differential diagnosis includes, but is not limited to, progressive cellulitis, lymphangitis, abscess reformation ? ?Patient to the ED for interim wound check 24 hours status post local I&D procedure.  Patient presents in no acute distress, reporting subjective improvement in his wound.  He notes continued spontaneous drainage, but also endorses significant improvement of his pain, swelling, and range of motion distally.  Patient's diagnosis is consistent with healing cellulitis and abscess. Patient will be discharged  home with directions to continue previously prescribed antibiotics.  Patient discharged with wound care instructions and supplies.  He is advised to rest with the hand on the chest, and to perform intermittent composite fist to help reduce distal soft tissue swelling.  Return precautions have again been reviewed.  Patient is to follow up with this ED or local urgent care as needed or otherwise directed. Patient is given ED precautions to return to the ED for any worsening or new symptoms. ? ? ?FINAL CLINICAL IMPRESSION(S) / ED DIAGNOSES  ? ?Final diagnoses:  ?Wound check, abscess  ? ? ? ?Rx / DC Orders  ? ?ED Discharge Orders   ? ? None  ? ?  ? ? ? ?Note:  This document was prepared using Dragon voice recognition software and may include unintentional dictation errors. ? ?  ?Tamzin Bertling, Charlesetta Ivory, PA-C ?01/16/22 2325 ? ?  ?Minna Antis, MD ?01/20/22 1510 ? ?

## 2022-01-20 LAB — AEROBIC/ANAEROBIC CULTURE W GRAM STAIN (SURGICAL/DEEP WOUND): Gram Stain: NONE SEEN
